# Patient Record
Sex: Female | Born: 1960 | Race: Black or African American | Hispanic: No | Marital: Single | Smoking: Never smoker
Health system: Southern US, Community
[De-identification: ages and names within clinical notes are randomized; demographics above are authoritative.]

## PROBLEM LIST (undated history)

## (undated) ENCOUNTER — Ambulatory Visit: Admission: EM | Payer: BLUE CROSS/BLUE SHIELD | Source: Home / Self Care

## (undated) DIAGNOSIS — J45909 Unspecified asthma, uncomplicated: Secondary | ICD-10-CM

## (undated) DIAGNOSIS — M1990 Unspecified osteoarthritis, unspecified site: Secondary | ICD-10-CM

## (undated) DIAGNOSIS — R011 Cardiac murmur, unspecified: Secondary | ICD-10-CM

## (undated) DIAGNOSIS — I10 Essential (primary) hypertension: Secondary | ICD-10-CM

## (undated) HISTORY — PX: HAND SURGERY: SHX662

## (undated) HISTORY — PX: FOOT SURGERY: SHX648

## (undated) HISTORY — PX: ABDOMINAL HYSTERECTOMY: SHX81

---

## 2004-09-29 ENCOUNTER — Emergency Department: Payer: Self-pay | Admitting: Emergency Medicine

## 2005-02-14 ENCOUNTER — Ambulatory Visit: Payer: Self-pay | Admitting: Orthopedic Surgery

## 2005-04-13 ENCOUNTER — Ambulatory Visit: Payer: Self-pay | Admitting: Orthopedic Surgery

## 2005-04-20 ENCOUNTER — Ambulatory Visit: Payer: Self-pay | Admitting: Orthopedic Surgery

## 2007-05-20 DIAGNOSIS — J309 Allergic rhinitis, unspecified: Secondary | ICD-10-CM | POA: Insufficient documentation

## 2007-05-20 DIAGNOSIS — J4520 Mild intermittent asthma, uncomplicated: Secondary | ICD-10-CM | POA: Insufficient documentation

## 2007-05-20 DIAGNOSIS — G8929 Other chronic pain: Secondary | ICD-10-CM | POA: Insufficient documentation

## 2007-05-23 ENCOUNTER — Ambulatory Visit: Payer: Self-pay | Admitting: Family Medicine

## 2008-11-04 ENCOUNTER — Emergency Department: Payer: Self-pay | Admitting: Internal Medicine

## 2009-08-29 ENCOUNTER — Emergency Department: Payer: Self-pay | Admitting: Emergency Medicine

## 2010-01-12 ENCOUNTER — Emergency Department: Payer: Self-pay | Admitting: Emergency Medicine

## 2010-05-03 ENCOUNTER — Emergency Department: Payer: Self-pay | Admitting: Emergency Medicine

## 2012-12-22 DIAGNOSIS — M542 Cervicalgia: Secondary | ICD-10-CM | POA: Insufficient documentation

## 2013-07-21 ENCOUNTER — Ambulatory Visit: Payer: Self-pay | Admitting: Internal Medicine

## 2013-08-18 DIAGNOSIS — G5600 Carpal tunnel syndrome, unspecified upper limb: Secondary | ICD-10-CM | POA: Insufficient documentation

## 2013-11-27 ENCOUNTER — Ambulatory Visit: Payer: Self-pay | Admitting: Physician Assistant

## 2014-03-03 ENCOUNTER — Ambulatory Visit: Payer: Self-pay | Admitting: Family Medicine

## 2014-03-03 LAB — CBC WITH DIFFERENTIAL/PLATELET
BASOS ABS: 0.1 10*3/uL (ref 0.0–0.1)
Basophil %: 1.3 %
EOS PCT: 4.3 %
Eosinophil #: 0.2 10*3/uL (ref 0.0–0.7)
HCT: 38.8 % (ref 35.0–47.0)
HGB: 12.7 g/dL (ref 12.0–16.0)
LYMPHS ABS: 1.4 10*3/uL (ref 1.0–3.6)
Lymphocyte %: 30.2 %
MCH: 29.8 pg (ref 26.0–34.0)
MCHC: 32.7 g/dL (ref 32.0–36.0)
MCV: 91 fL (ref 80–100)
Monocyte #: 0.4 x10 3/mm (ref 0.2–0.9)
Monocyte %: 8.6 %
Neutrophil #: 2.6 10*3/uL (ref 1.4–6.5)
Neutrophil %: 55.6 %
PLATELETS: 226 10*3/uL (ref 150–440)
RBC: 4.26 10*6/uL (ref 3.80–5.20)
RDW: 13.2 % (ref 11.5–14.5)
WBC: 4.6 10*3/uL (ref 3.6–11.0)

## 2014-03-03 LAB — COMPREHENSIVE METABOLIC PANEL
ALT: 19 U/L (ref 12–78)
Albumin: 3.9 g/dL (ref 3.4–5.0)
Alkaline Phosphatase: 96 U/L
Anion Gap: 6 — ABNORMAL LOW (ref 7–16)
BUN: 12 mg/dL (ref 7–18)
Bilirubin,Total: 0.5 mg/dL (ref 0.2–1.0)
CALCIUM: 9.6 mg/dL (ref 8.5–10.1)
CREATININE: 0.86 mg/dL (ref 0.60–1.30)
Chloride: 103 mmol/L (ref 98–107)
Co2: 30 mmol/L (ref 21–32)
EGFR (African American): >60
EGFR (Non-African Amer.): >60
GLUCOSE: 94 mg/dL (ref 65–99)
OSMOLALITY: 277 (ref 275–301)
Potassium: 3.7 mmol/L (ref 3.5–5.1)
SGOT(AST): 17 U/L (ref 15–37)
Sodium: 139 mmol/L (ref 136–145)
Total Protein: 7.8 g/dL (ref 6.4–8.2)

## 2014-07-20 ENCOUNTER — Ambulatory Visit: Payer: Self-pay | Admitting: Family Medicine

## 2014-07-20 LAB — RAPID STREP-A WITH REFLX: MICRO TEXT REPORT: NEGATIVE

## 2014-07-23 LAB — BETA STREP CULTURE(ARMC)

## 2014-09-14 ENCOUNTER — Ambulatory Visit: Payer: Self-pay | Admitting: Physician Assistant

## 2014-09-14 LAB — URINALYSIS, COMPLETE
BILIRUBIN, UR: NEGATIVE
GLUCOSE, UR: NEGATIVE
Ketone: NEGATIVE
Nitrite: NEGATIVE
PH: 7 (ref 5.0–8.0)
Protein: NEGATIVE
SPECIFIC GRAVITY: 1.015 (ref 1.000–1.030)

## 2014-09-16 LAB — URINE CULTURE

## 2015-01-04 ENCOUNTER — Ambulatory Visit
Admit: 2015-01-04 | Disposition: A | Discharge status: Home / Self Care | Payer: Self-pay | Attending: Family Medicine | Admitting: Family Medicine

## 2015-03-22 ENCOUNTER — Emergency Department: Payer: Worker's Compensation

## 2015-03-22 ENCOUNTER — Ambulatory Visit
Admission: EM | Admit: 2015-03-22 | Discharge: 2015-03-22 | Payer: Worker's Compensation | Attending: Family Medicine | Admitting: Family Medicine

## 2015-03-22 ENCOUNTER — Encounter: Payer: Self-pay | Admitting: *Deleted

## 2015-03-22 ENCOUNTER — Emergency Department
Admission: EM | Admit: 2015-03-22 | Discharge: 2015-03-22 | Disposition: A | Discharge status: Home / Self Care | Payer: Worker's Compensation | Attending: Emergency Medicine | Admitting: Emergency Medicine

## 2015-03-22 DIAGNOSIS — S0990XA Unspecified injury of head, initial encounter: Secondary | ICD-10-CM

## 2015-03-22 DIAGNOSIS — M545 Low back pain, unspecified: Secondary | ICD-10-CM

## 2015-03-22 DIAGNOSIS — S39012D Strain of muscle, fascia and tendon of lower back, subsequent encounter: Secondary | ICD-10-CM | POA: Diagnosis not present

## 2015-03-22 DIAGNOSIS — S4992XD Unspecified injury of left shoulder and upper arm, subsequent encounter: Secondary | ICD-10-CM | POA: Diagnosis not present

## 2015-03-22 DIAGNOSIS — W010XXD Fall on same level from slipping, tripping and stumbling without subsequent striking against object, subsequent encounter: Secondary | ICD-10-CM | POA: Insufficient documentation

## 2015-03-22 DIAGNOSIS — S0990XD Unspecified injury of head, subsequent encounter: Secondary | ICD-10-CM | POA: Insufficient documentation

## 2015-03-22 DIAGNOSIS — M25512 Pain in left shoulder: Secondary | ICD-10-CM

## 2015-03-22 DIAGNOSIS — S79921D Unspecified injury of right thigh, subsequent encounter: Secondary | ICD-10-CM | POA: Diagnosis not present

## 2015-03-22 DIAGNOSIS — Z880 Allergy status to penicillin: Secondary | ICD-10-CM | POA: Insufficient documentation

## 2015-03-22 DIAGNOSIS — M25552 Pain in left hip: Secondary | ICD-10-CM

## 2015-03-22 DIAGNOSIS — I10 Essential (primary) hypertension: Secondary | ICD-10-CM | POA: Diagnosis not present

## 2015-03-22 HISTORY — DX: Essential (primary) hypertension: I10

## 2015-03-22 HISTORY — DX: Cardiac murmur, unspecified: R01.1

## 2015-03-22 HISTORY — DX: Unspecified asthma, uncomplicated: J45.909

## 2015-03-22 MED ORDER — KETOROLAC TROMETHAMINE 10 MG PO TABS
20.0000 mg | ORAL_TABLET | Freq: Once | ORAL | Status: AC
  Administered 2015-03-22: 20 mg via ORAL

## 2015-03-22 MED ORDER — CYCLOBENZAPRINE HCL 10 MG PO TABS
5.0000 mg | ORAL_TABLET | Freq: Once | ORAL | Status: AC
  Administered 2015-03-22: 5 mg via ORAL

## 2015-03-22 MED ORDER — CYCLOBENZAPRINE HCL 10 MG PO TABS
ORAL_TABLET | ORAL | Status: AC
  Administered 2015-03-22: 5 mg via ORAL
  Filled 2015-03-22: qty 1

## 2015-03-22 MED ORDER — CYCLOBENZAPRINE HCL 5 MG PO TABS: 5.0000 mg | ORAL_TABLET | Freq: Three times a day (TID) | ORAL | Status: DC | PRN

## 2015-03-22 MED ORDER — KETOROLAC TROMETHAMINE 10 MG PO TABS
ORAL_TABLET | ORAL | Status: AC
  Administered 2015-03-22: 20 mg via ORAL
  Filled 2015-03-22: qty 2

## 2015-03-22 MED ORDER — KETOROLAC TROMETHAMINE 10 MG PO TABS: 10.0000 mg | ORAL_TABLET | Freq: Three times a day (TID) | ORAL | Status: DC

## 2015-03-22 NOTE — ED Notes (Signed)
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 I hit my left hip and side, and twist my left should back, and I have pain in the back side head (the right side)." Pt denies LOC. Ambulated to exam room 8 without problem.

## 2015-03-22 NOTE — ED Provider Notes (Signed)
CSN: [id number]     Arrival date & time 03/22/15  1531 History   First MD Initiated Contact with Patient 03/22/15 1629     Chief Complaint  Patient presents with  . Fall  . Shoulder Pain  . Head Injury   (Consider location/radiation/quality/duration/timing/severity/associated sxs/prior Treatment) HPI   54 yo F working at [employer] ,placed by [employer]. 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 Approximately 2:45 pm on a return trip to her workspot she fell between the pallet and a metal railing falling to the concrete floor. Remembers the beginning of the fall and later becoming aware that people were around her asking is she was OK and bringing ice pack.. Not sure what happened immediately after fall. Her "right side back of head really hurt bad"and continues to be painful. She reports  unable to get up by herself,was assisted by workmates to a chair, then moved to the workroom. Given tylenol 2 tablets by her supervisor then brought to [facility].States she is not sure if she lost consciousness. Continues to have a headache. Vision is not clear -she doesn't have her glasses with her. She is ambulatory but complaining of left shoulder, left hip, mid low back pain and right thigh discomfort.  Reported as very active. Works out at [employer] and "stay busy". Past Medical History  Diagnosis Date  . Asthma   . Hypertension    Past Surgical History  Procedure Laterality Date  . Abdominal hysterectomy     No family history on file. History  Substance Use Topics  . Smoking status: Never Smoker   . Smokeless tobacco: Not on file  . Alcohol Use: No   OB History    No data available     Review of Systems Constitutional: No fever.  Eyes: No identified visual changes. She doesn't have her glasses. ENT:No sore throat. Cardiovascular:Negative for chest pain/palpitations Respiratory: Negative for  shortness of breath Gastrointestinal: No abdominal pain. No nausea,vomiting, diarrhea Genitourinary: Negative for dysuria. Normal urination. Musculoskeletal: Positive  for lumbar back pain. FROM extremities - left shoulder discomfort, Left hip and right thigh becoming uncomfortable . Left forearm in wrist support from reported tendonitis predating current experience Skin: Negative for rash Neurological: Positive for headache, Negative  focal weakness or numbness Allergies  Codeine; Penicillins; Percocet; Sulfa antibiotics; and Vicodin  Home Medications   Prior to Admission medications   Medication Sig Start Date End Date Taking? Authorizing Provider  albuterol (PROVENTIL HFA;VENTOLIN HFA) 108 (90 BASE) MCG/ACT inhaler Inhale into the lungs every 6 (six) hours as needed for wheezing or shortness of breath.   Yes Historical Provider, MD  lisinopril (PRINIVIL,ZESTRIL) 10 MG tablet Take 10 mg by mouth daily.   Yes Historical Provider, MD   BP 132/76 mmHg  Pulse 71  Temp(Src) 97.8 F (36.6 C) (Tympanic)  Resp 16  Ht  (1.626 m)  Wt 125 lb (56.7 kg)  BMI 21.45 kg/m2  SpO2 100%  LMP  Physical Exam   Constitutional -alert and oriented, in distress c/o right occiput pain Left shoulder, left hip, low back, right thigh discomfort Head- normocephalic, very tender area right occiput to behind right ear Eyes- conjunctiva normal, EOMI ,conjugate gaze Ears- canal clear, TM without evidence of bleed Nose- no congestion or rhinorrhea Mouth/throat- mucous membranes moist ,oropharynx non-erythematous Neck- supple without glandular enlargement, no pain  CV- regular rate, grossly normal heart sounds,  Resp-no distress, normal respiratory effort,clear to auscultation bilaterally Back- very tender to palpation midline low back GI- soft,non-tender,no distention GU-  not examined MSK- Left shoulder full ROM with tenderness at overhead, left hip FROM with tenderness,   right thigh becoming tense  and uncomfortable during visit by her report; on and of table without assistance, ambulatory Neuro- normal speech and language, no gross focal neurological deficit appreciated, no gait instability, CNS as tested grossly WNL Skin-warm,dry ,intact; no rash noted Psych-mood and affect grossly normal; speech and behavior grossly normal  ED Course  Procedures (including critical care time) Labs Review Labs Reviewed - No data to display  Imaging Review No results found.   MDM   1. Head trauma, initial encounter   2. Left shoulder pain   3. Left hip pain   4. Midline low back pain without sciatica     Head trauma concerns discussed with patient and both daughters. Recommend that she transfer to the ER of her choice for more indepth evaluation as we do not have CT available. They wish to transport and she is in agreement. She travelled in by private vehicle and is felt stable for travel. [facility] ER triage nurse is contacted and our baseline information given. Her urinalysis and bretholyzer  Studies have been complete and submitted from here.  Rae HalstedLaurie W Lee, PA-C 03/22/15 [place] [id number]

## 2015-03-22 NOTE — ED Notes (Signed)
Pt here with multiple areas of pain after falling from standing position today.  Pt advises she fell at work at 14:45 and now has a H/A, low back pain, l shoulder pain and right leg pain.

## 2015-03-22 NOTE — ED Provider Notes (Signed)
[facility] Emergency Department Provider Note ____________________________________________  Time seen: 1  I have reviewed the triage vital signs and the nursing notes.  HISTORY  Chief Complaint  Fall  HPI Cindy Fisher is a 54 y.o. female who reports to the ED, transported by her daughter, for evaluation and management of symptoms sustained after a ground level fall at work today. She was evaluated at the [facility] urgent care prior to arrival here, at about 3:30 this afternoon. She describes to me a fall due to tripping, at about 2:45 PM. She was walking between pallets and a rail, at her workstation when she inadvertently tripped, falling, she describes to her right side. She denies head injury here, but notes pain to the posterior head and scalp.She also is complaining of right low back pain, right thigh pain, and some left shoulder discomfort, that she thinks was sustained when she fell landing with the left arm behind her. She is here at the encouragement of the provider at urgent care, for further evaluation of her presumed closed head injury.  Past Medical History  Diagnosis Date  . Asthma   . Hypertension   . Heart murmur    There are no active problems to display for this patient.  Past Surgical History  Procedure Laterality Date  . Abdominal hysterectomy     Current Outpatient Rx  Name  Route  Sig  Dispense  Refill  . albuterol (PROVENTIL HFA;VENTOLIN HFA) 108 (90 BASE) MCG/ACT inhaler   Inhalation   Inhale into the lungs every 6 (six) hours as needed for wheezing or shortness of breath.         . cyclobenzaprine (FLEXERIL) 5 MG tablet   Oral   Take 1 tablet (5 mg total) by mouth every 8 (eight) hours as needed for muscle spasms.   10 tablet   0   . ketorolac (TORADOL) 10 MG tablet   Oral   Take 1 tablet (10 mg total) by mouth every 8 (eight) hours.   15 tablet   0   . lisinopril (PRINIVIL,ZESTRIL) 10 MG tablet   Oral   Take 10 mg  by mouth daily.          Allergies Codeine; Penicillins; Percocet; Sulfa antibiotics; and Vicodin  No family history on file.  Social History History  Substance Use Topics  . Smoking status: Never Smoker   . Smokeless tobacco: Not on file  . Alcohol Use: No   Review of Systems  Constitutional: Negative for fever. Eyes: Negative for visual changes. ENT: Negative for sore throat. Cardiovascular: Negative for chest pain. Respiratory: Negative for shortness of breath. Gastrointestinal: Negative for abdominal pain, vomiting and diarrhea. Genitourinary: Negative for dysuria. Musculoskeletal: Positive for low back pain, right thigh, and left shoulder pain. Skin: Negative for rash. Neurological: Negative for headaches, focal weakness or numbness. Reports posterior head pain.  ____________________________________________  PHYSICAL EXAM:  VITAL SIGNS: ED Triage Vitals  Enc Vitals Group     BP --      Pulse --      Resp --      Temp --      Temp src --      SpO2 --      Weight --      Height --      Head Cir --      Peak Flow --      Pain Score 03/22/15 1833 10     Pain Loc --  Pain Edu? --      Excl. in GC? --    Constitutional: Alert and oriented. Well appearing and in no distress. Eyes: Conjunctivae are normal. PERRL. Normal extraocular movements. Normal fundi bilaterally.  ENT   Head: Normocephalic and atraumatic. Mildly tender to palp over the posterior right occiput without erythema, edema, induration, abrasion, or laceration.    Nose: No congestion/rhinnorhea/epistaxis.      Ears: TMs clear bilaterally without bulge or rupture. Canals clear.    Mouth/Throat: Mucous membranes are moist.   Neck: Supple. No thyromegaly. Hematological/Lymphatic/Immunilogical: No cervical lymphadenopathy. Cardiovascular: Normal rate, regular rhythm.  Respiratory: Normal respiratory effort. No wheezes/rales/rhonchi. Gastrointestinal: Soft and nontender. No  distention. Musculoskeletal: Nontender with normal range of motion in all extremities. Normal spinal alignment without spasm, deformity, or step-off. Normal sit-stand transition. Normal lumbar flexion to the ankles, and normal extension. Negative SLR bilaterally.  Neurologic:  Normal gait without ataxia. Normal speech and language. No gross focal neurologic deficits are appreciated. CN II-XII grossly intact. Normal toe/heel raise.  Skin:  Skin is warm, dry and intact. No rash noted. Psychiatric: Mood and affect are normal. Patient exhibits appropriate insight and judgment. ____________________________________________   RADIOLOGY Head CT w/o contrast IMPRESSION: No acute intracranial process. ____________________________________________  PROCEDURES  Toradol 20 mg PO Flexeril 10 mg PO ____________________________________________  INITIAL IMPRESSION / ASSESSMENT AND PLAN / ED COURSE  Ground level fall with minor head injury and questionable LOC. Normal exam and head CT without neurological deficit. Strain to the low back and left shoulder related to fall.  Follow-up with W/C provider or company representative. Prescription Toradol #15 and Flexeril #10 as directed. Work note for OOW x 3 days given. ____________________________________________  FINAL CLINICAL IMPRESSION(S) / ED DIAGNOSES  Final diagnoses:  Fall from slip, trip, or stumble, subsequent encounter  Minor head injury, subsequent encounter  Lumbar strain, subsequent encounter     Lissa HoardJenise V Bacon Jericka Kadar, PA-C 03/22/15 2020  Arnaldo NatalPaul F Malinda, MD 03/22/15 2232

## 2015-03-22 NOTE — ED Notes (Signed)
Breath Analysis and Drug Urine Screen Collected per [employer] request. [facility] as directed by Provider for further workup.

## 2015-03-22 NOTE — Discharge Instructions (Signed)
Head Injury You have a head injury. Headaches and throwing up (vomiting) are common after a head injury. It should be easy to wake up from sleeping. Sometimes you must stay in the hospital. Most problems happen within the first 24 hours. Side effects may occur up to 7-10 days after the injury.  WHAT ARE THE TYPES OF HEAD INJURIES? Head injuries can be as minor as a bump. Some head injuries can be more severe. More severe head injuries include:  A jarring injury to the brain (concussion).  A bruise of the brain (contusion). This mean there is bleeding in the brain that can cause swelling.  A cracked skull (skull fracture).  Bleeding in the brain that collects, clots, and forms a bump (hematoma). WHEN SHOULD I GET HELP RIGHT AWAY?   You are confused or sleepy.  You cannot be woken up.  You feel sick to your stomach (nauseous) or keep throwing up (vomiting).  Your dizziness or unsteadiness is getting worse.  You have very bad, lasting headaches that are not helped by medicine. Take medicines only as told by your doctor.  You cannot use your arms or legs like normal.  You cannot walk.  You notice changes in the black spots in the center of the colored part of your eye (pupil).  You have clear or bloody fluid coming from your nose or ears.  You have trouble seeing. During the next 24 hours after the injury, you must stay with someone who can watch you. This person should get help right away (call 911 in the [place] if you start to shake and are not able to control it (have seizures), you pass out, or you are unable to wake up. HOW CAN I PREVENT A HEAD INJURY IN THE FUTURE?  Wear seat belts.  Wear a helmet while bike riding and playing sports like football.  Stay away from dangerous activities around the house. WHEN CAN I RETURN TO NORMAL ACTIVITIES AND ATHLETICS? See your doctor before doing these activities. You should not do normal activities or play contact sports until 1 week  after the following symptoms have stopped:  Headache that does not go away.  Dizziness.  Poor attention.  Confusion.  Memory problems.  Sickness to your stomach or throwing up.  Tiredness.  Fussiness.  Bothered by bright lights or loud noises.  Anxiousness or depression.  Restless sleep. MAKE SURE YOU:   Understand these instructions.  Will watch your condition.  Will get help right away if you are not doing well or get worse. Document Released: 08/16/2008 Document Revised: 01/18/2014 Document Reviewed: 05/11/2013 [facility] Patient Information 2015 [place], [place]. This information is not intended to replace advice given to you by your health care provider. Make sure you discuss any questions you have with your health care provider.  Lumbosacral Strain Lumbosacral strain is a strain of any of the parts that make up your lumbosacral vertebrae. Your lumbosacral vertebrae are the bones that make up the lower third of your backbone. Your lumbosacral vertebrae are held together by muscles and tough, fibrous tissue (ligaments).  CAUSES  A sudden blow to your back can cause lumbosacral strain. Also, anything that causes an excessive stretch of the muscles in the low back can cause this strain. This is typically seen when people exert themselves strenuously, fall, lift heavy objects, bend, or crouch repeatedly. RISK FACTORS  Physically demanding work.  Participation in pushing or pulling sports or sports that require a sudden twist of the back (tennis,  golf, baseball).  Weight lifting.  Excessive lower back curvature.  Forward-tilted pelvis.  Weak back or abdominal muscles or both.  Tight hamstrings. SIGNS AND SYMPTOMS  Lumbosacral strain may cause pain in the area of your injury or pain that moves (radiates) down your leg.  DIAGNOSIS Your health care provider can often diagnose lumbosacral strain through a physical exam. In some cases, you may need tests such as X-ray  exams.  TREATMENT  Treatment for your lower back injury depends on many factors that your clinician will have to evaluate. However, most treatment will include the use of anti-inflammatory medicines. HOME CARE INSTRUCTIONS   Avoid hard physical activities (tennis, racquetball, waterskiing) if you are not in proper physical condition for it. This may aggravate or create problems.  If you have a back problem, avoid sports requiring sudden body movements. Swimming and walking are generally safer activities.  Maintain good posture.  Maintain a healthy weight.  For acute conditions, you may put ice on the injured area.  Put ice in a plastic bag.  Place a towel between your skin and the bag.  Leave the ice on for 20 minutes, 2-3 times a day.  When the low back starts healing, stretching and strengthening exercises may be recommended. SEEK MEDICAL CARE IF:  Your back pain is getting worse.  You experience severe back pain not relieved with medicines. SEEK IMMEDIATE MEDICAL CARE IF:   You have numbness, tingling, weakness, or problems with the use of your arms or legs.  There is a change in bowel or bladder control.  You have increasing pain in any area of the body, including your belly (abdomen).  You notice shortness of breath, dizziness, or feel faint.  You feel sick to your stomach (nauseous), are throwing up (vomiting), or become sweaty.  You notice discoloration of your toes or legs, or your feet get very cold. MAKE SURE YOU:   Understand these instructions.  Will watch your condition.  Will get help right away if you are not doing well or get worse. Document Released: 06/13/2005 Document Revised: 09/08/2013 Document Reviewed: 04/22/2013 [facility] Patient Information 2015 [place]. This information is not intended to replace advice given to you by your health care provider. Make sure you discuss any questions you have with your health care provider.  Your exam  and head CT were normal tonight following your fall at work.  Keep ice on any sore muscles. Take the prescription meds as directed.  Follow-up with [facility] or your company's medical provider. Your symptoms should resolve in a few days.

## 2015-03-28 ENCOUNTER — Encounter: Payer: Self-pay | Admitting: Emergency Medicine

## 2015-03-28 ENCOUNTER — Ambulatory Visit
Admission: EM | Admit: 2015-03-28 | Discharge: 2015-03-28 | Disposition: A | Discharge status: Home / Self Care | Payer: Worker's Compensation | Attending: Internal Medicine | Admitting: Internal Medicine

## 2015-03-28 DIAGNOSIS — T148XXA Other injury of unspecified body region, initial encounter: Secondary | ICD-10-CM

## 2015-03-28 DIAGNOSIS — S39012D Strain of muscle, fascia and tendon of lower back, subsequent encounter: Secondary | ICD-10-CM

## 2015-03-28 DIAGNOSIS — S46912D Strain of unspecified muscle, fascia and tendon at shoulder and upper arm level, left arm, subsequent encounter: Secondary | ICD-10-CM

## 2015-03-28 DIAGNOSIS — S76012D Strain of muscle, fascia and tendon of left hip, subsequent encounter: Secondary | ICD-10-CM

## 2015-03-28 MED ORDER — KETOROLAC TROMETHAMINE 10 MG PO TABS: 10.0000 mg | ORAL_TABLET | Freq: Four times a day (QID) | ORAL | Status: DC | PRN

## 2015-03-28 MED ORDER — METAXALONE 800 MG PO TABS: 800.0000 mg | ORAL_TABLET | Freq: Three times a day (TID) | ORAL | Status: DC

## 2015-03-28 NOTE — ED Provider Notes (Signed)
CSN: [id number]     Arrival date & time 03/28/15  0808 History   First MD Initiated Contact with Patient 03/28/15 [phone]     Chief Complaint  Patient presents with  . 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  Patient reported still having muscle spasms taking flexeril at bedtime unsure if helping because makes her sleepy/falls asleep and toradol po once a day.  Noticed large bruise on left hip next day.  Still having left shoulder pain also especially if raising arm overhead. Hx left wrist tendonitis still wearing wrist splint/having pain.  Neck muscles/scapular muscles tight/spasms.   Denied headache, nausea, vomiting, loss of bowel/bladder control, saddle paresthesias, arm/leg weakness.  The history is provided by the patient.    Past Medical History  Diagnosis Date  . Asthma   . Hypertension   . Heart murmur    Past Surgical History  Procedure Laterality Date  . Abdominal hysterectomy     History reviewed. No pertinent family history. History  Substance Use Topics  . Smoking status: Never Smoker   . Smokeless tobacco: Not on file  . Alcohol Use: No   OB History    No data available     Review of Systems  Constitutional: Negative for fever, chills, diaphoresis, activity change, appetite change and fatigue.  HENT: Negative for facial swelling, nosebleeds, trouble swallowing and voice change.   Eyes: Negative for photophobia, pain, discharge, redness, itching and visual disturbance.  Respiratory: Negative for cough, choking, shortness of breath, wheezing and stridor.   Cardiovascular: Negative for chest pain, palpitations and leg swelling.  Gastrointestinal: Negative for nausea, vomiting, abdominal pain, diarrhea, constipation and blood in stool.    Endocrine: Negative for cold intolerance and heat intolerance.  Genitourinary: Negative for dysuria and difficulty urinating.  Musculoskeletal: Positive for myalgias, back pain and arthralgias. Negative for joint swelling, gait problem, neck pain and neck stiffness.  Skin: Positive for color change. Negative for pallor, rash and wound.  Allergic/Immunologic: Negative for environmental allergies and food allergies.  Neurological: Negative for dizziness, tremors, seizures, syncope, facial asymmetry, speech difficulty, weakness, light-headedness, numbness and headaches.  Hematological: Negative for adenopathy. Does not bruise/bleed easily.  Psychiatric/Behavioral: Negative for behavioral problems, confusion, sleep disturbance, decreased concentration and agitation.    Allergies  Codeine; Penicillins; Percocet; Sulfa antibiotics; and Vicodin  Home Medications   Prior to Admission medications   Medication Sig Start Date End Date Taking? Authorizing Provider  albuterol (PROVENTIL HFA;VENTOLIN HFA) 108 (90 BASE) MCG/ACT inhaler Inhale into the lungs every 6 (six) hours as needed for wheezing or shortness of breath.    Historical Provider, MD  ketorolac (TORADOL) 10 MG tablet Take 1 tablet (10 mg total) by mouth every 8 (eight) hours. 03/22/15   Jenise V Bacon Menshew, PA-C  ketorolac (TORADOL) 10 MG tablet Take 1 tablet (10 mg total) by mouth every 6 (six) hours as needed for moderate pain. 03/28/15   Barbaraann Barthelina A Betancourt, NP  lisinopril (PRINIVIL,ZESTRIL) 10 MG tablet Take 10 mg by mouth daily.    Historical Provider, MD  metaxalone (SKELAXIN) 800 MG tablet Take 1 tablet (800 mg total) by mouth 3 (three) times daily. 03/28/15   Barbaraann Barthelina A Betancourt, NP   BP 166/76 mmHg  Pulse 60  Temp(Src) 97.5 F (36.4 C) (Tympanic)  Resp 16  Ht 5\' 4"  (1.626  m)  SpO2 100% Physical Exam  Constitutional: She is oriented to person, place, and time. Vital signs are normal. She appears well-developed and well-nourished.  No distress.  HENT:  Head: Normocephalic and atraumatic.  Right Ear: External ear normal.  Left Ear: External ear normal.  Nose: Nose normal.  Mouth/Throat: Oropharynx is clear and moist. No oropharyngeal exudate.  Eyes: Conjunctivae, EOM and lids are normal. Pupils are equal, round, and reactive to light. Right eye exhibits no discharge. Left eye exhibits no discharge. No scleral icterus.  Neck: Trachea normal and normal range of motion. Neck supple. No spinous process tenderness and no muscular tenderness present. No rigidity. No tracheal deviation, no edema, no erythema and normal range of motion present. No Brudzinski's sign and no Kernig's sign noted. No thyromegaly present.  Bilateral trapezius muscles tight  Cardiovascular: Normal rate, regular rhythm, normal heart sounds and intact distal pulses.  Exam reveals no gallop and no friction rub.   No murmur heard. Pulmonary/Chest: Effort normal and breath sounds normal. No stridor. No respiratory distress. She has no wheezes.  Abdominal: Soft. She exhibits no distension.  Musculoskeletal: She exhibits tenderness. She exhibits no edema.       Right shoulder: Normal.       Left shoulder: She exhibits decreased range of motion, tenderness and pain. She exhibits no bony tenderness, no swelling, no effusion, no crepitus, no deformity, no laceration, no spasm, normal pulse and normal strength.       Right elbow: Normal.      Left elbow: Normal.       Right wrist: Normal.       Left wrist: She exhibits tenderness. She exhibits normal range of motion, no bony tenderness, no swelling, no effusion, no crepitus, no deformity and no laceration.       Right hip: Normal.       Left hip: She exhibits tenderness. She exhibits normal range of motion, normal strength, no bony tenderness, no swelling, no crepitus, no deformity and no laceration.       Cervical back: Normal.       Thoracic back: She exhibits tenderness, pain and spasm. She exhibits normal  range of motion, no bony tenderness, no swelling, no edema, no deformity, no laceration and normal pulse.       Lumbar back: She exhibits tenderness, pain and spasm. She exhibits normal range of motion, no bony tenderness, no swelling, no edema, no deformity, no laceration and normal pulse.       Back:       Left upper arm: She exhibits no tenderness, no bony tenderness, no swelling, no edema, no deformity and no laceration.       Left forearm: Normal.       Left hand: Normal.  Right rotation and lateral bending worsen back pain; atchley scratch above head worsens left shoulder pain; negative empty can test; abduction/adduction left arm worsens pain; unchanged with internal and external rotation left shoulder pain  Lymphadenopathy:    She has no cervical adenopathy.  Neurological: She is alert and oriented to person, place, and time. She has normal reflexes. She displays normal reflexes. No cranial nerve deficit. She exhibits normal muscle tone. Coordination normal.  Skin: Skin is warm, dry and intact. Bruising and ecchymosis noted. No abrasion, no burn, no laceration, no lesion, no petechiae and no rash noted. She is not diaphoretic. No cyanosis or erythema. No pallor. Nails show no clubbing.     Ecchymosis purple, green, yellow 5x3 cm left  lateral hip  Psychiatric: She has a normal mood and affect. Her speech is normal and behavior is normal. Judgment and thought content normal. Cognition and memory are normal.  Nursing note and vitals reviewed.   ED Course  Procedures (including critical care time) Labs Review Labs Reviewed - No data to display  Imaging Review No results found.   MDM   1. Low back strain, subsequent encounter   2. Contusion   3. Hip strain, left, subsequent encounter   4. Shoulder strain, left, subsequent encounter    For acute pain, rest, and intermittent application of heat (do not sleep on heating pad).  I discussed longer-term treatment plan of PRN PO NSAIDS  and I discussed a home back care exercise program with a strengthening and flexibility exercise.  Patient given Exitcare handout on back strain with rehab exercises.  Will change from flexeril to skelaxin due to sedation.  Avoid operation of dangerous machinery and alcohol intake while on skelaxin.  Avoid dehydration take toradol at least twice per day if still having pain may take up to QID.  Avoid other NSAID intake while taking toradol.  Proper avoidance of heavy lifting (greater than 10 lbs) discussed. Avoid impact activities to left hand/arm.  Consider physical therapy or chiropractic care and radiology if not improving.  Call or return to clinic as needed if these symptoms worsen or fail to improve as anticipated especially leg weakness, loss of bowel/bladder control or saddle paresthesias.   Patient verbalized understanding of instructions/information and agreed with plan of care.  P2:  Injury Prevention, fitness  Patient was instructed to rest, ice and elevate arm.  Exitcare handout on wrist pain given to patient.   Medications as directed.  No NSAIDS while taking toradol.  Work note with restrictions given to patient.  Call or return to clinic as needed if these symptoms worsen or fail to improve as anticipated and will consider orthopedics evaluation.  Patient verbalized agreement and understanding of treatment plan.  P2:  ROM, injury prevention  Patient given work note with restrictions.  Patient was instructed to rest, ice, and ROM exercises.  Activity as tolerated.  Patient is to take OTC po NSAIDS as needed.  Follow up if symptoms persist or worsen.  Exitcare handout on hip pain given to patient.  Follow up in 1 week for re-evaluation.  Patient verbalized agreement and understanding of treatment plan.  P2:  Injury Prevention and Fitness.    Barbaraann Barthel, NP 03/28/15 1519

## 2015-03-28 NOTE — ED Notes (Signed)
Patient here for follow-up visit for worker's comp injury. Patient c/o ongoing pain in her lower back and left arm.

## 2015-03-28 NOTE — Discharge Instructions (Signed)
Hip Pain Your hip is the joint between your upper legs and your lower pelvis. The bones, cartilage, tendons, and muscles of your hip joint perform a lot of work each day supporting your body weight and allowing you to move around. Hip pain can range from a minor ache to severe pain in one or both of your hips. Pain may be felt on the inside of the hip joint near the groin, or the outside near the buttocks and upper thigh. You may have swelling or stiffness as well.  HOME CARE INSTRUCTIONS   Take medicines only as directed by your health care provider.  Apply ice to the injured area:  Put ice in a plastic bag.  Place a towel between your skin and the bag.  Leave the ice on for 15-20 minutes at a time, 3-4 times a day.  Keep your leg raised (elevated) when possible to lessen swelling.  Avoid activities that cause pain.  Follow specific exercises as directed by your health care provider.  Sleep with a pillow between your legs on your most comfortable side.  Record how often you have hip pain, the location of the pain, and what it feels like. SEEK MEDICAL CARE IF:   You are unable to put weight on your leg.  Your hip is red or swollen or very tender to touch.  Your pain or swelling continues or worsens after 1 week.  You have increasing difficulty walking.  You have a fever. SEEK IMMEDIATE MEDICAL CARE IF:   You have fallen.  You have a sudden increase in pain and swelling in your hip. MAKE SURE YOU:   Understand these instructions.  Will watch your condition.  Will get help right away if you are not doing well or get worse. Document Released: 02/21/2010 Document Revised: 01/18/2014 Document Reviewed: 04/30/2013 [facility] Patient Information 2015 [place], [place]. This information is not intended to replace advice given to you by your health care provider. Make sure you discuss any questions you have with your health care provider. Shoulder Sprain A shoulder sprain is the  result of damage to the tough, fiber-like tissues (ligaments) that help hold your shoulder in place. The ligaments may be stretched or torn. Besides the main shoulder joint (the ball and socket), there are several smaller joints that connect the bones in this area. A sprain usually involves one of those joints. Most often it is the acromioclavicular (or AC) joint. That is the joint that connects the collarbone (clavicle) and the shoulder blade (scapula) at the top point of the shoulder blade (acromion). A shoulder sprain is a mild form of what is called a shoulder separation. Recovering from a shoulder sprain may take some time. For some, pain lingers for several months. Most people recover without long term problems. CAUSES   A shoulder sprain is usually caused by some kind of trauma. This might be:  Falling on an outstretched arm.  Being hit hard on the shoulder.  Twisting the arm.  Shoulder sprains are more likely to occur in people who:  Play sports.  Have balance or coordination problems. SYMPTOMS   Pain when you move your shoulder.  Limited ability to move the shoulder.  Swelling and tenderness on top of the shoulder.  Redness or warmth in the shoulder.  Bruising.  A change in the shape of the shoulder. DIAGNOSIS  Your healthcare provider may:  Ask about your symptoms.  Ask about recent activity that might have caused those symptoms.  Examine your  shoulder. You may be asked to do simple exercises to test movement. The other shoulder will be examined for comparison.  Order some tests that provide a look inside the body. They can show the extent of the injury. The tests could include:  X-rays.  CT (computed tomography) scan.  MRI (magnetic resonance imaging) scan. RISKS AND COMPLICATIONS  Loss of full shoulder motion.  Ongoing shoulder pain. TREATMENT  How long it takes to recover from a shoulder sprain depends on how severe it was. Treatment options may  include:  Rest. You should not use the arm or shoulder until it heals.  Ice. For 2 or 3 days after the injury, put an ice pack on the shoulder up to 4 times a day. It should stay on for 15 to 20 minutes each time. Wrap the ice in a towel so it does not touch your skin.  Over-the-counter medicine to relieve pain.  A sling or brace. This will keep the arm still while the shoulder is healing.  Physical therapy or rehabilitation exercises. These will help you regain strength and motion. Ask your healthcare provider when it is OK to begin these exercises.  Surgery. The need for surgery is rare with a sprained shoulder, but some people may need surgery to keep the joint in place and reduce pain. HOME CARE INSTRUCTIONS   Ask your healthcare provider about what you should and should not do while your shoulder heals.  Make sure you know how to apply ice to the correct area of your shoulder.  Talk with your healthcare provider about which medications should be used for pain and swelling.  If rehabilitation therapy will be needed, ask your healthcare provider to refer you to a therapist. If it is not recommended, then ask about at-home exercises. Find out when exercise should begin. SEEK MEDICAL CARE IF:  Your pain, swelling, or redness at the joint increases. SEEK IMMEDIATE MEDICAL CARE IF:   You have a fever.  You cannot move your arm or shoulder. Document Released: 01/20/2009 Document Revised: 11/26/2011 Document Reviewed: 01/20/2009 [facility] Patient Information 2015 [place], [place]. This information is not intended to replace advice given to you by your health care provider. Make sure you discuss any questions you have with your health care provider. Contusion A contusion is a deep bruise. Contusions are the result of an injury that caused bleeding under the skin. The contusion may turn blue, purple, or yellow. Minor injuries will give you a painless contusion, but more severe contusions may  stay painful and swollen for a few weeks.  CAUSES  A contusion is usually caused by a blow, trauma, or direct force to an area of the body. SYMPTOMS   Swelling and redness of the injured area.  Bruising of the injured area.  Tenderness and soreness of the injured area.  Pain. DIAGNOSIS  The diagnosis can be made by taking a history and physical exam. An X-ray, CT scan, or MRI may be needed to determine if there were any associated injuries, such as fractures. TREATMENT  Specific treatment will depend on what area of the body was injured. In general, the best treatment for a contusion is resting, icing, elevating, and applying cold compresses to the injured area. Over-the-counter medicines may also be recommended for pain control. Ask your caregiver what the best treatment is for your contusion. HOME CARE INSTRUCTIONS   Put ice on the injured area.  Put ice in a plastic bag.  Place a towel between your skin  and the bag.  Leave the ice on for 15-20 minutes, 3-4 times a day, or as directed by your health care provider.  Only take over-the-counter or prescription medicines for pain, discomfort, or fever as directed by your caregiver. Your caregiver may recommend avoiding anti-inflammatory medicines (aspirin, ibuprofen, and naproxen) for 48 hours because these medicines may increase bruising.  Rest the injured area.  If possible, elevate the injured area to reduce swelling. SEEK IMMEDIATE MEDICAL CARE IF:   You have increased bruising or swelling.  You have pain that is getting worse.  Your swelling or pain is not relieved with medicines. MAKE SURE YOU:   Understand these instructions.  Will watch your condition.  Will get help right away if you are not doing well or get worse. Document Released: 06/13/2005 Document Revised: 09/08/2013 Document Reviewed: 07/09/2011 [facility] Patient Information 2015 [place], [place]. This information is not intended to replace advice given to  you by your health care provider. Make sure you discuss any questions you have with your health care provider. Mid-Back Strain with Rehab  A strain is an injury in which a tendon or muscle is torn. The muscles and tendons of the mid-back are vulnerable to strains. However, these muscles and tendons are very strong and require a great force to be injured. The muscles of the mid-back are responsible for stabilizing the spinal column, as well as spinal twisting (rotation). Strains are classified into three categories. Grade 1 strains cause pain, but the tendon is not lengthened. Grade 2 strains include a lengthened ligament, due to the ligament being stretched or partially ruptured. With grade 2 strains there is still function, although the function may be decreased. Grade 3 strains involve a complete tear of the tendon or muscle, and function is usually impaired. SYMPTOMS   Pain in the middle of the back.  Pain that may affect only one side, and is worse with movement.  Muscle spasms, and often swelling in the back.  Loss of strength of the back muscles.  Crackling sound (crepitation) when the muscles are touched. CAUSES  Mid-back strains occur when a force is placed on the muscles or tendons that is greater than they can handle. Common causes of injury include:  Ongoing overuse of the muscle-tendon units in the middle back, usually from incorrect body posture.  A single violent injury or force applied to the back. RISK INCREASES WITH:  Sports that involve twisting forces on the spine or a lot of bending at the waist (football, rugby, weightlifting, bowling, golf, tennis, speed skating, racquetball, swimming, running, gymnastics, diving).  Poor strength and flexibility.  Failure to warm up properly before activity.  Family history of low back pain or disk disorders.  Previous back injury or surgery (especially fusion). PREVENTION  Learn and use proper sports technique.  Warm up and  stretch properly before activity.  Allow for adequate recovery between workouts.  Maintain physical fitness:  Strength, flexibility, and endurance.  Cardiovascular fitness. PROGNOSIS  If treated properly, mid-back strains usually heal within 6 weeks. RELATED COMPLICATIONS   Frequently recurring symptoms, resulting in a chronic problem. Properly treating the problem the first time decreases frequency of recurrence.  Chronic inflammation, scarring, and partial muscle-tendon tear.  Delayed healing or resolution of symptoms, especially if activity is resumed too soon.  Prolonged disability. TREATMENT Treatment first involves the use of ice and medicine, to reduce pain and inflammation. As the pain begins to subside, you may begin strengthening and stretching exercises to improve body  posture and sport technique. These exercises may be performed at home or with a therapist. Severe injuries may require referral to a therapist for further evaluation and treatment, such as ultrasound. Corticosteroid injections may be given to help reduce inflammation. Biofeedback (watching monitors of your body processes) and psychotherapy may also be prescribed. Prolonged bed rest is felt to do more harm than good. Massage may help break the muscle spasms. Sometimes, an injection of cortisone, with or without local anesthetics, may be given to help relieve the pain and spasms. MEDICATION   If pain medicine is needed, nonsteroidal anti-inflammatory medicines (aspirin and ibuprofen), or other minor pain relievers (acetaminophen), are often advised.  Do not take pain medicine for 7 days before surgery.  Prescription pain relievers may be given, if your caregiver thinks they are needed. Use only as directed and only as much as you need.  Ointments applied to the skin may be helpful.  Corticosteroid injections may be given by your caregiver. These injections should be reserved for the most serious cases, because  they may only be given a certain number of times. HEAT AND COLD:   Cold treatment (icing) should be applied for 10 to 15 minutes every 2 to 3 hours for inflammation and pain, and immediately after activity that aggravates your symptoms. Use ice packs or an ice massage.  Heat treatment may be used before performing stretching and strengthening activities prescribed by your caregiver, physical therapist, or athletic trainer. Use a heat pack or a warm water soak. SEEK IMMEDIATE MEDICAL CARE IF:  Symptoms get worse or do not improve in 2 to 4 weeks, despite treatment.  You develop numbness, weakness, or loss of bowel or bladder function.  New, unexplained symptoms develop. (Drugs used in treatment may produce side effects.) EXERCISES RANGE OF MOTION (ROM) AND STRETCHING EXERCISES - Mid-Back Strain These exercises may help you when beginning to rehabilitate your injury. In order to successfully resolve your symptoms, you must improve your posture. These exercises are designed to help reduce the forward-head and rounded-shoulder posture which contributes to this condition. Your symptoms may resolve with or without further involvement from your physician, physical therapist or athletic trainer. While completing these exercises, remember:   Restoring tissue flexibility helps normal motion to return to the joints. This allows healthier, less painful movement and activity.  An effective stretch should be held for at least 30 seconds.  A stretch should never be painful. You should only feel a gentle lengthening or release in the stretched tissue. STRETCH - Axial Extension  Stand or sit on a firm surface. Assume a good posture: chest up, shoulders drawn back, stomach muscles slightly tense, knees unlocked (if standing) and feet hip width apart.  Slowly retract your chin, so your head slides back and your chin slightly lowers. Continue to look straight ahead.  You should feel a gentle stretch in the  back of your head. Be certain not to feel an aggressive stretch since this can cause headaches later.  Hold for __________ seconds. Repeat __________ times. Complete this exercise __________ times per day. RANGE OF MOTION- Upper Thoracic Extension  Sit on a firm chair with a high back. Assume a good posture: chest up, shoulders drawn back, abdominal muscles slightly tense, and feet hip width apart. Place a small pillow or folded towel in the curve of your lower back, if you are having difficulty maintaining good posture.  Gently brace your neck with your hands, allowing your arms to rest on your chest.  Continue to support your neck and slowly extend your back over the chair. You will feel a stretch across your upper back.  Hold __________ seconds. Slowly return to the starting position. Repeat __________ times. Complete this exercise __________ times per day. RANGE OF MOTION- Mid-Thoracic Extension  Roll a towel so that it is about 4 inches in diameter.  Position the towel lengthwise. Lay on the towel so that your spine, but not your shoulder blades, are supported.  You should feel your mid-back arching toward the floor. To increase the stretch, extend your arms away from your body.  Hold for __________ seconds. Repeat exercise __________ times, __________ times per day. STRENGTHENING EXERCISES - Mid-Back Strain These exercises may help you when beginning to rehabilitate your injury. They may resolve your symptoms with or without further involvement from your physician, physical therapist or athletic trainer. While completing these exercises, remember:   Muscles can gain both the endurance and the strength needed for everyday activities through controlled exercises.  Complete these exercises as instructed by your physician, physical therapist or athletic trainer. Increase the resistance and repetitions only as guided by your caregiver.  You may experience muscle soreness or fatigue,  but the pain or discomfort you are trying to eliminate should never worsen during these exercises. If this pain does worsen, stop and make certain you are following the directions exactly. If the pain is still present after adjustments, discontinue the exercise until you can discuss the trouble with your caregiver. STRENGTHENING - Quadruped, Opposite UE/LE Lift  Assume a hands and knees position on a firm surface. Keep your hands under your shoulders and your knees under your hips. You may place padding under your knees for comfort.  Find your neutral spine and gently tense your abdominal muscles so that you can maintain this position. Your shoulders and hips should form a rectangle that is parallel with the floor and is not twisted.  Keeping your trunk steady, lift your right hand no higher than your shoulder and then your left leg no higher than your hip. Make sure you are not holding your breath. Hold this position __________ seconds.  Continuing to keep your abdominal muscles tense and your back steady, slowly return to your starting position. Repeat with the opposite arm and leg. Repeat __________ times. Complete this exercise __________ times per day.  STRENGTH - Shoulder Extensors  Secure a rubber exercise band or tubing to a fixed object (table, pole) so that it is at the height of your shoulders when you are either standing, or sitting on a firm armless chair.  With a thumbs-up grip, grasp an end of the band in each hand. Straighten your elbows and lift your hands straight in front of you at shoulder height. Step back away from the secured end of band, until it becomes tense.  Squeezing your shoulder blades together, pull your hands down to the sides of your thighs. Do not allow your hands to go behind you.  Hold for __________ seconds. Slowly ease the tension on the band, as you reverse the directions and return to the starting position. Repeat __________ times. Complete this exercise  __________ times per day.  STRENGTH - Horizontal Abductors Choose one of the two positions to complete this exercise. Prone: lying on stomach:  Lie on your stomach on a firm surface so that your right / left arm overhangs the edge. Rest your forehead on your opposite forearm. With your palm facing the floor and your elbow straight, hold  a __________ weight in your hand.  Squeeze your right / left shoulder blade to your mid-back spine and then slowly raise your arm to the height of the bed.  Hold for __________ seconds. Slowly reverse the directions and return to the starting position, controlling the weight as you lower your arm. Repeat __________ times. Complete this exercise __________ times per day. Standing:   Secure a rubber exercise band or tubing, so that it is at the height of your shoulders when you are either standing, or sitting on a firm armless chair.  Grasp an end of the band in each hand and have your palms face each other. Straighten your elbows and lift your hands straight in front of you at shoulder height. Step back away from the secured end of band, until it becomes tense.  Squeeze your shoulder blades together. Keeping your elbows locked and your hands at shoulder height, spread your arms apart, forming a "T" shape with your body. Hold __________ seconds. Slowly ease the tension on the band, as you reverse the directions and return to the starting position. Repeat __________ times. Complete this exercise __________ times per day. STRENGTH - Scapular Retractors and External Rotators, Rowing  Secure a rubber exercise band or tubing, so that it is at the height of your shoulders when you are either standing, or sitting on a firm armless chair.  With a palm-down grip, grasp an end of the band in each hand. Straighten your elbows and lift your hands straight in front of you at shoulder height. Step back away from the secured end of band, until it becomes tense.  Step 1:  Squeeze your shoulder blades together. Bending your elbows, draw your hands to your chest as if you are rowing a boat. At the end of this motion, your hands and elbow should be at shoulder height and your elbows should be out to your sides.  Step 2: Rotate your shoulder to raise your hands above your head. Your forearms should be vertical and your upper arms should be horizontal.  Hold for __________ seconds. Slowly ease the tension on the band, as you reverse the directions and return to the starting position. Repeat __________ times. Complete this exercise __________ times per day.  POSTURE AND BODY MECHANICS CONSIDERATIONS - Mid-Back Strain Keeping correct posture when sitting, standing or completing your activities will reduce the stress put on different body tissues, allowing injured tissues a chance to heal and limiting painful experiences. The following are general guidelines for improved posture. Your physician or physical therapist will provide you with any instructions specific to your needs. While reading these guidelines, remember:  The exercises prescribed by your provider will help you have the flexibility and strength to maintain correct postures.  The correct posture provides the best environment for your joints to work. All of your joints have less wear and tear when properly supported by a spine with good posture. This means you will experience a healthier, less painful body.  Correct posture must be practiced with all of your activities, especially prolonged sitting and standing. Correct posture is as important when doing repetitive low-stress activities (typing) as it is when doing a single heavy-load activity (lifting). PROPER SITTING POSTURE In order to minimize stress and discomfort on your spine, you must sit with correct posture. Sitting with good posture should be effortless for a healthy body. Returning to good posture is a gradual process. Many people can work toward this  most comfortably by using various supports  until they have the flexibility and strength to maintain this posture on their own. When sitting with proper posture, your ears will fall over your shoulders and your shoulders will fall over your hips. You should use the back of the chair to support your upper back. Your lower back will be in a neutral position, just slightly arched. You may place a small pillow or folded towel at the base of your low back for  support.  When working at a desk, create an environment that supports good, upright posture. Without extra support, muscles fatigue and lead to excessive strain on joints and other tissues. Keep these recommendations in mind: CHAIR:  A chair should be able to slide under your desk when your back makes contact with the back of the chair. This allows you to work closely.  The chair's height should allow your eyes to be level with the upper part of your monitor and your hands to be slightly lower than your elbows. BODY POSITION  Your feet should make contact with the floor. If this is not possible, use a foot rest.  Keep your ears over your shoulders. This will reduce stress on your neck and lower back. INCORRECT SITTING POSTURES If you are feeling tired and unable to assume a healthy sitting posture, do not slouch or slump. This puts excessive strain on your back tissues, causing more damage and pain. Healthier options include:  Using more support, like a lumbar pillow.  Switching tasks to something that requires you to be upright or walking.  Talking a brief walk.  Lying down to rest in a neutral-spine position. CORRECT STANDING POSTURES Proper standing posture should be assumed with all daily activities, even if they only take a few moments, like when brushing your teeth. As in sitting, your ears should fall over your shoulders and your shoulders should fall over your hips. You should keep a slight tension in your abdominal muscles to brace  your spine. Your tailbone should point down to the ground, not behind your body, resulting in an over-extended swayback posture.  INCORRECT STANDING POSTURES Common incorrect standing postures include a forward head, locked knees, and an excessive swayback. WALKING Walk with an upright posture. Your ears, shoulders and hips should all line-up. CORRECT LIFTING TECHNIQUES DO :   Assume a wide stance. This will provide you more stability and the opportunity to get as close as possible to the object which you are lifting.  Tense your abdominals to brace your spine. Bend at the knees and hips. Keeping your back locked in a neutral-spine position, lift using your leg muscles. Lift with your legs, keeping your back straight.  Test the weight of unknown objects before attempting to lift them.  Try to keep your elbows locked down at your sides in order get the best strength from your shoulders when carrying an object.  Always ask for help when lifting heavy or awkward objects. INCORRECT LIFTING TECHNIQUES DO NOT:   Lock your knees when lifting, even if it is a small object.  Bend and twist. Pivot at your feet or move your feet when needing to change directions.  Assume that you can safely pick up even a paperclip without proper posture. Document Released: 09/03/2005 Document Revised: 01/18/2014 Document Reviewed: 12/16/2008 [facility] Patient Information 2015 [place], [place]. This information is not intended to replace advice given to you by your health care provider. Make sure you discuss any questions you have with your health care provider. Low Back Strain with  Rehab A strain is an injury in which a tendon or muscle is torn. The muscles and tendons of the lower back are vulnerable to strains. However, these muscles and tendons are very strong and require a great force to be injured. Strains are classified into three categories. Grade 1 strains cause pain, but the tendon is not lengthened. Grade  2 strains include a lengthened ligament, due to the ligament being stretched or partially ruptured. With grade 2 strains there is still function, although the function may be decreased. Grade 3 strains involve a complete tear of the tendon or muscle, and function is usually impaired. SYMPTOMS   Pain in the lower back.  Pain that affects one side more than the other.  Pain that gets worse with movement and may be felt in the hip, buttocks, or back of the thigh.  Muscle spasms of the muscles in the back.  Swelling along the muscles of the back.  Loss of strength of the back muscles.  Crackling sound (crepitation) when the muscles are touched. CAUSES  Lower back strains occur when a force is placed on the muscles or tendons that is greater than they can handle. Common causes of injury include:  Prolonged overuse of the muscle-tendon units in the lower back, usually from incorrect posture.  A single violent injury or force applied to the back. RISK INCREASES WITH:  Sports that involve twisting forces on the spine or a lot of bending at the waist (football, rugby, weightlifting, bowling, golf, tennis, speed skating, racquetball, swimming, running, gymnastics, diving).  Poor strength and flexibility.  Failure to warm up properly before activity.  Family history of lower back pain or disk disorders.  Previous back injury or surgery (especially fusion).  Poor posture with lifting, especially heavy objects.  Prolonged sitting, especially with poor posture. PREVENTION   Learn and use proper posture when sitting or lifting (maintain proper posture when sitting, lift using the knees and legs, not at the waist).  Warm up and stretch properly before activity.  Allow for adequate recovery between workouts.  Maintain physical fitness:  Strength, flexibility, and endurance.  Cardiovascular fitness. PROGNOSIS  If treated properly, lower back strains usually heal within 6  weeks. RELATED COMPLICATIONS   Recurring symptoms, resulting in a chronic problem.  Chronic inflammation, scarring, and partial muscle-tendon tear.  Delayed healing or resolution of symptoms.  Prolonged disability. TREATMENT  Treatment first involves the use of ice and medicine, to reduce pain and inflammation. The use of strengthening and stretching exercises may help reduce pain with activity. These exercises may be performed at home or with a therapist. Severe injuries may require referral to a therapist for further evaluation and treatment, such as ultrasound. Your caregiver may advise that you wear a back brace or corset, to help reduce pain and discomfort. Often, prolonged bed rest results in greater harm then benefit. Corticosteroid injections may be recommended. However, these should be reserved for the most serious cases. It is important to avoid using your back when lifting objects. At night, sleep on your back on a firm mattress with a pillow placed under your knees. If non-surgical treatment is unsuccessful, surgery may be needed.  MEDICATION   If pain medicine is needed, nonsteroidal anti-inflammatory medicines (aspirin and ibuprofen), or other minor pain relievers (acetaminophen), are often advised.  Do not take pain medicine for 7 days before surgery.  Prescription pain relievers may be given, if your caregiver thinks they are needed. Use only as directed and only  as much as you need.  Ointments applied to the skin may be helpful.  Corticosteroid injections may be given by your caregiver. These injections should be reserved for the most serious cases, because they may only be given a certain number of times. HEAT AND COLD  Cold treatment (icing) should be applied for 10 to 15 minutes every 2 to 3 hours for inflammation and pain, and immediately after activity that aggravates your symptoms. Use ice packs or an ice massage.  Heat treatment may be used before performing  stretching and strengthening activities prescribed by your caregiver, physical therapist, or athletic trainer. Use a heat pack or a warm water soak. SEEK MEDICAL CARE IF:   Symptoms get worse or do not improve in 2 to 4 weeks, despite treatment.  You develop numbness, weakness, or loss of bowel or bladder function.  New, unexplained symptoms develop. (Drugs used in treatment may produce side effects.) EXERCISES  RANGE OF MOTION (ROM) AND STRETCHING EXERCISES - Low Back Strain Most people with lower back pain will find that their symptoms get worse with excessive bending forward (flexion) or arching at the lower back (extension). The exercises which will help resolve your symptoms will focus on the opposite motion.  Your physician, physical therapist or athletic trainer will help you determine which exercises will be most helpful to resolve your lower back pain. Do not complete any exercises without first consulting with your caregiver. Discontinue any exercises which make your symptoms worse until you speak to your caregiver.  If you have pain, numbness or tingling which travels down into your buttocks, leg or foot, the goal of the therapy is for these symptoms to move closer to your back and eventually resolve. Sometimes, these leg symptoms will get better, but your lower back pain may worsen. This is typically an indication of progress in your rehabilitation. Be very alert to any changes in your symptoms and the activities in which you participated in the 24 hours prior to the change. Sharing this information with your caregiver will allow him/her to most efficiently treat your condition.  These exercises may help you when beginning to rehabilitate your injury. Your symptoms may resolve with or without further involvement from your physician, physical therapist or athletic trainer. While completing these exercises, remember:  Restoring tissue flexibility helps normal motion to return to the joints.  This allows healthier, less painful movement and activity.  An effective stretch should be held for at least 30 seconds.  A stretch should never be painful. You should only feel a gentle lengthening or release in the stretched tissue. FLEXION RANGE OF MOTION AND STRETCHING EXERCISES: STRETCH - Flexion, Single Knee to Chest   Lie on a firm bed or floor with both legs extended in front of you.  Keeping one leg in contact with the floor, bring your opposite knee to your chest. Hold your leg in place by either grabbing behind your thigh or at your knee.  Pull until you feel a gentle stretch in your lower back. Hold __________ seconds.  Slowly release your grasp and repeat the exercise with the opposite side. Repeat __________ times. Complete this exercise __________ times per day.  STRETCH - Flexion, Double Knee to Chest   Lie on a firm bed or floor with both legs extended in front of you.  Keeping one leg in contact with the floor, bring your opposite knee to your chest.  Tense your stomach muscles to support your back and then lift your other  knee to your chest. Hold your legs in place by either grabbing behind your thighs or at your knees.  Pull both knees toward your chest until you feel a gentle stretch in your lower back. Hold __________ seconds.  Tense your stomach muscles and slowly return one leg at a time to the floor. Repeat __________ times. Complete this exercise __________ times per day.  STRETCH - Low Trunk Rotation  Lie on a firm bed or floor. Keeping your legs in front of you, bend your knees so they are both pointed toward the ceiling and your feet are flat on the floor.  Extend your arms out to the side. This will stabilize your upper body by keeping your shoulders in contact with the floor.  Gently and slowly drop both knees together to one side until you feel a gentle stretch in your lower back. Hold for __________ seconds.  Tense your stomach muscles to support  your lower back as you bring your knees back to the starting position. Repeat the exercise to the other side. Repeat __________ times. Complete this exercise __________ times per day  EXTENSION RANGE OF MOTION AND FLEXIBILITY EXERCISES: STRETCH - Extension, Prone on Elbows   Lie on your stomach on the floor, a bed will be too soft. Place your palms about shoulder width apart and at the height of your head.  Place your elbows under your shoulders. If this is too painful, stack pillows under your chest.  Allow your body to relax so that your hips drop lower and make contact more completely with the floor.  Hold this position for __________ seconds.  Slowly return to lying flat on the floor. Repeat __________ times. Complete this exercise __________ times per day.  RANGE OF MOTION - Extension, Prone Press Ups  Lie on your stomach on the floor, a bed will be too soft. Place your palms about shoulder width apart and at the height of your head.  Keeping your back as relaxed as possible, slowly straighten your elbows while keeping your hips on the floor. You may adjust the placement of your hands to maximize your comfort. As you gain motion, your hands will come more underneath your shoulders.  Hold this position __________ seconds.  Slowly return to lying flat on the floor. Repeat __________ times. Complete this exercise __________ times per day.  RANGE OF MOTION- Quadruped, Neutral Spine   Assume a hands and knees position on a firm surface. Keep your hands under your shoulders and your knees under your hips. You may place padding under your knees for comfort.  Drop your head and point your tail bone toward the ground below you. This will round out your lower back like an angry cat. Hold this position for __________ seconds.  Slowly lift your head and release your tail bone so that your back sags into a large arch, like an old horse.  Hold this position for __________ seconds.  Repeat  this until you feel limber in your lower back.  Now, find your "sweet spot." This will be the most comfortable position somewhere between the two previous positions. This is your neutral spine. Once you have found this position, tense your stomach muscles to support your lower back.  Hold this position for __________ seconds. Repeat __________ times. Complete this exercise __________ times per day.  STRENGTHENING EXERCISES - Low Back Strain These exercises may help you when beginning to rehabilitate your injury. These exercises should be done near your "sweet spot." This is  the neutral, low-back arch, somewhere between fully rounded and fully arched, that is your least painful position. When performed in this safe range of motion, these exercises can be used for people who have either a flexion or extension based injury. These exercises may resolve your symptoms with or without further involvement from your physician, physical therapist or athletic trainer. While completing these exercises, remember:   Muscles can gain both the endurance and the strength needed for everyday activities through controlled exercises.  Complete these exercises as instructed by your physician, physical therapist or athletic trainer. Increase the resistance and repetitions only as guided.  You may experience muscle soreness or fatigue, but the pain or discomfort you are trying to eliminate should never worsen during these exercises. If this pain does worsen, stop and make certain you are following the directions exactly. If the pain is still present after adjustments, discontinue the exercise until you can discuss the trouble with your caregiver. STRENGTHENING - Deep Abdominals, Pelvic Tilt  Lie on a firm bed or floor. Keeping your legs in front of you, bend your knees so they are both pointed toward the ceiling and your feet are flat on the floor.  Tense your lower abdominal muscles to press your lower back into the  floor. This motion will rotate your pelvis so that your tail bone is scooping upwards rather than pointing at your feet or into the floor.  With a gentle tension and even breathing, hold this position for __________ seconds. Repeat __________ times. Complete this exercise __________ times per day.  STRENGTHENING - Abdominals, Crunches   Lie on a firm bed or floor. Keeping your legs in front of you, bend your knees so they are both pointed toward the ceiling and your feet are flat on the floor. Cross your arms over your chest.  Slightly tip your chin down without bending your neck.  Tense your abdominals and slowly lift your trunk high enough to just clear your shoulder blades. Lifting higher can put excessive stress on the lower back and does not further strengthen your abdominal muscles.  Control your return to the starting position. Repeat __________ times. Complete this exercise __________ times per day.  STRENGTHENING - Quadruped, Opposite UE/LE Lift   Assume a hands and knees position on a firm surface. Keep your hands under your shoulders and your knees under your hips. You may place padding under your knees for comfort.  Find your neutral spine and gently tense your abdominal muscles so that you can maintain this position. Your shoulders and hips should form a rectangle that is parallel with the floor and is not twisted.  Keeping your trunk steady, lift your right hand no higher than your shoulder and then your left leg no higher than your hip. Make sure you are not holding your breath. Hold this position __________ seconds.  Continuing to keep your abdominal muscles tense and your back steady, slowly return to your starting position. Repeat with the opposite arm and leg. Repeat __________ times. Complete this exercise __________ times per day.  STRENGTHENING - Lower Abdominals, Double Knee Lift  Lie on a firm bed or floor. Keeping your legs in front of you, bend your knees so they  are both pointed toward the ceiling and your feet are flat on the floor.  Tense your abdominal muscles to brace your lower back and slowly lift both of your knees until they come over your hips. Be certain not to hold your breath.  Hold __________  seconds. Using your abdominal muscles, return to the starting position in a slow and controlled manner. Repeat __________ times. Complete this exercise __________ times per day.  POSTURE AND BODY MECHANICS CONSIDERATIONS - Low Back Strain Keeping correct posture when sitting, standing or completing your activities will reduce the stress put on different body tissues, allowing injured tissues a chance to heal and limiting painful experiences. The following are general guidelines for improved posture. Your physician or physical therapist will provide you with any instructions specific to your needs. While reading these guidelines, remember:  The exercises prescribed by your provider will help you have the flexibility and strength to maintain correct postures.  The correct posture provides the best environment for your joints to work. All of your joints have less wear and tear when properly supported by a spine with good posture. This means you will experience a healthier, less painful body.  Correct posture must be practiced with all of your activities, especially prolonged sitting and standing. Correct posture is as important when doing repetitive low-stress activities (typing) as it is when doing a single heavy-load activity (lifting). RESTING POSITIONS Consider which positions are most painful for you when choosing a resting position. If you have pain with flexion-based activities (sitting, bending, stooping, squatting), choose a position that allows you to rest in a less flexed posture. You would want to avoid curling into a fetal position on your side. If your pain worsens with extension-based activities (prolonged standing, working overhead), avoid  resting in an extended position such as sleeping on your stomach. Most people will find more comfort when they rest with their spine in a more neutral position, neither too rounded nor too arched. Lying on a non-sagging bed on your side with a pillow between your knees, or on your back with a pillow under your knees will often provide some relief. Keep in mind, being in any one position for a prolonged period of time, no matter how correct your posture, can still lead to stiffness. PROPER SITTING POSTURE In order to minimize stress and discomfort on your spine, you must sit with correct posture. Sitting with good posture should be effortless for a healthy body. Returning to good posture is a gradual process. Many people can work toward this most comfortably by using various supports until they have the flexibility and strength to maintain this posture on their own. When sitting with proper posture, your ears will fall over your shoulders and your shoulders will fall over your hips. You should use the back of the chair to support your upper back. Your lower back will be in a neutral position, just slightly arched. You may place a small pillow or folded towel at the base of your lower back for support.  When working at a desk, create an environment that supports good, upright posture. Without extra support, muscles tire, which leads to excessive strain on joints and other tissues. Keep these recommendations in mind: CHAIR:  A chair should be able to slide under your desk when your back makes contact with the back of the chair. This allows you to work closely.  The chair's height should allow your eyes to be level with the upper part of your monitor and your hands to be slightly lower than your elbows. BODY POSITION  Your feet should make contact with the floor. If this is not possible, use a foot rest.  Keep your ears over your shoulders. This will reduce stress on your neck and lower back. INCORRECT  SITTING POSTURES  If you are feeling tired and unable to assume a healthy sitting posture, do not slouch or slump. This puts excessive strain on your back tissues, causing more damage and pain. Healthier options include:  Using more support, like a lumbar pillow.  Switching tasks to something that requires you to be upright or walking.  Talking a brief walk.  Lying down to rest in a neutral-spine position. PROLONGED STANDING WHILE SLIGHTLY LEANING FORWARD  When completing a task that requires you to lean forward while standing in one place for a long time, place either foot up on a stationary 2-4 inch high object to help maintain the best posture. When both feet are on the ground, the lower back tends to lose its slight inward curve. If this curve flattens (or becomes too large), then the back and your other joints will experience too much stress, tire more quickly, and can cause pain. CORRECT STANDING POSTURES Proper standing posture should be assumed with all daily activities, even if they only take a few moments, like when brushing your teeth. As in sitting, your ears should fall over your shoulders and your shoulders should fall over your hips. You should keep a slight tension in your abdominal muscles to brace your spine. Your tailbone should point down to the ground, not behind your body, resulting in an over-extended swayback posture.  INCORRECT STANDING POSTURES  Common incorrect standing postures include a forward head, locked knees and/or an excessive swayback. WALKING Walk with an upright posture. Your ears, shoulders and hips should all line-up. PROLONGED ACTIVITY IN A FLEXED POSITION When completing a task that requires you to bend forward at your waist or lean over a low surface, try to find a way to stabilize 3 out of 4 of your limbs. You can place a hand or elbow on your thigh or rest a knee on the surface you are reaching across. This will provide you more stability so that your  muscles do not fatigue as quickly. By keeping your knees relaxed, or slightly bent, you will also reduce stress across your lower back. CORRECT LIFTING TECHNIQUES DO :   Assume a wide stance. This will provide you more stability and the opportunity to get as close as possible to the object which you are lifting.  Tense your abdominals to brace your spine. Bend at the knees and hips. Keeping your back locked in a neutral-spine position, lift using your leg muscles. Lift with your legs, keeping your back straight.  Test the weight of unknown objects before attempting to lift them.  Try to keep your elbows locked down at your sides in order get the best strength from your shoulders when carrying an object.  Always ask for help when lifting heavy or awkward objects. INCORRECT LIFTING TECHNIQUES DO NOT:   Lock your knees when lifting, even if it is a small object.  Bend and twist. Pivot at your feet or move your feet when needing to change directions.  Assume that you can safely pick up even a paper clip without proper posture. Document Released: 09/03/2005 Document Revised: 11/26/2011 Document Reviewed: 12/16/2008 [facility] Patient Information 2015 [place], [place]. This information is not intended to replace advice given to you by your health care provider. Make sure you discuss any questions you have with your health care provider.

## 2015-04-04 ENCOUNTER — Encounter: Payer: Self-pay | Admitting: Emergency Medicine

## 2015-04-04 ENCOUNTER — Ambulatory Visit
Admission: EM | Admit: 2015-04-04 | Discharge: 2015-04-04 | Disposition: A | Discharge status: Home / Self Care | Payer: Worker's Compensation | Attending: Internal Medicine | Admitting: Internal Medicine

## 2015-04-04 DIAGNOSIS — S76012D Strain of muscle, fascia and tendon of left hip, subsequent encounter: Secondary | ICD-10-CM | POA: Diagnosis not present

## 2015-04-04 DIAGNOSIS — S46912D Strain of unspecified muscle, fascia and tendon at shoulder and upper arm level, left arm, subsequent encounter: Secondary | ICD-10-CM | POA: Diagnosis not present

## 2015-04-04 DIAGNOSIS — T148 Other injury of unspecified body region: Secondary | ICD-10-CM | POA: Diagnosis not present

## 2015-04-04 DIAGNOSIS — T148XXA Other injury of unspecified body region, initial encounter: Secondary | ICD-10-CM

## 2015-04-04 DIAGNOSIS — S39012D Strain of muscle, fascia and tendon of lower back, subsequent encounter: Secondary | ICD-10-CM | POA: Diagnosis not present

## 2015-04-04 MED ORDER — METAXALONE 800 MG PO TABS: 800.0000 mg | ORAL_TABLET | Freq: Three times a day (TID) | ORAL | Status: DC

## 2015-04-04 MED ORDER — ACETAMINOPHEN 500 MG PO TABS: 1000.0000 mg | ORAL_TABLET | Freq: Four times a day (QID) | ORAL | Status: DC | PRN

## 2015-04-04 NOTE — ED Notes (Signed)
Patient here for a worker's comp follow-up visit.  Patient reports some improvement in her L hip pain.

## 2015-04-04 NOTE — ED Provider Notes (Signed)
CSN: [id number]     Arrival date & time 04/04/15  1139 History   First MD Initiated Contact with Patient 04/04/15 1309     Chief Complaint  Patient presents with  . Worker's Comp Follow-up Visit    (Consider location/radiation/quality/duration/timing/severity/associated sxs/prior Treatment) HPI Comments: African [place] female here for follow up visit s/p fall at work 22 Mar 2015 [employer] works for [employer] last seen one week ago.  Pain slightly decreased shoulder, hip, low back.  Patient reported still having muscle spasms taking skelaxin TID needs refills.  Reported improved somewhat pain and would like a different medication than toradol as constipation/nausea/chest tightness with use (like I used to have before an asthma attack)  Resolved after stopping toradol last week.  Noticed large bruise on left hip next day.  Still having left shoulder pain with lifting e.g. Laundry basket. Hx left wrist tendonitis still wearing wrist splint/having pain.  Neck muscles/scapular muscles tight/spasms.   Denied headache, vomiting, loss of bowel/bladder control, saddle paresthesias, arm/leg weakness.     The history is provided by the patient.    Past Medical History  Diagnosis Date  . Asthma   . Hypertension   . Heart murmur    Past Surgical History  Procedure Laterality Date  . Abdominal hysterectomy     History reviewed. No pertinent family history. History  Substance Use Topics  . Smoking status: Never Smoker   . Smokeless tobacco: Not on file  . Alcohol Use: No   OB History    No data available     Review of Systems  Constitutional: Negative for fever, chills, diaphoresis, activity change, appetite change and fatigue.  HENT: Negative for congestion, dental problem, drooling, ear discharge, ear pain, facial swelling, hearing loss, mouth sores, nosebleeds, postnasal drip, rhinorrhea, sore throat, trouble swallowing and voice change.   Eyes: Negative for  photophobia, pain, discharge, redness, itching and visual disturbance.  Respiratory: Negative for cough, chest tightness, shortness of breath, wheezing and stridor.   Cardiovascular: Negative for chest pain, palpitations and leg swelling.  Gastrointestinal: Positive for nausea, abdominal pain and constipation. Negative for vomiting, diarrhea, blood in stool, abdominal distention, anal bleeding and rectal pain.  Endocrine: Negative for cold intolerance and heat intolerance.  Genitourinary: Negative for dysuria, hematuria and enuresis.  Musculoskeletal: Positive for myalgias, back pain, arthralgias and neck pain. Negative for joint swelling, gait problem and neck stiffness.  Skin: Negative for color change, pallor, rash and wound.  Allergic/Immunologic: Negative for environmental allergies and food allergies.  Neurological: Negative for dizziness, tremors, facial asymmetry, weakness and headaches.  Hematological: Negative for adenopathy. Does not bruise/bleed easily.  Psychiatric/Behavioral: Negative for behavioral problems, confusion, sleep disturbance and agitation.    Allergies  Codeine; Penicillins; Percocet; Sulfa antibiotics; and Vicodin  Home Medications   Prior to Admission medications   Medication Sig Start Date End Date Taking? Authorizing Provider  acetaminophen (TYLENOL) 500 MG tablet Take 2 tablets (1,000 mg total) by mouth every 6 (six) hours as needed for moderate pain. 04/04/15   Barbaraann Barthel, NP  albuterol (PROVENTIL HFA;VENTOLIN HFA) 108 (90 BASE) MCG/ACT inhaler Inhale into the lungs every 6 (six) hours as needed for wheezing or shortness of breath.    Historical Provider, MD  lisinopril (PRINIVIL,ZESTRIL) 10 MG tablet Take 10 mg by mouth daily.    Historical Provider, MD  metaxalone (SKELAXIN) 800 MG tablet Take 1 tablet (800 mg total) by mouth 3 (three) times daily. 04/04/15   Inetta Fermo A  Betancourt, NP   BP 193/85 mmHg  Pulse 60  Temp(Src) 97.9 F (36.6 C) (Tympanic)   Resp 16  SpO2 100% Physical Exam  Constitutional: She is oriented to person, place, and time. Vital signs are normal. She appears well-developed and well-nourished. No distress.  HENT:  Head: Normocephalic and atraumatic.  Right Ear: External ear normal.  Left Ear: External ear normal.  Nose: Nose normal.  Mouth/Throat: Oropharynx is clear and moist.  Eyes: Conjunctivae, EOM and lids are normal. Pupils are equal, round, and reactive to light. Right eye exhibits no discharge. Left eye exhibits no discharge. No scleral icterus.  Neck: Trachea normal and normal range of motion. Neck supple. No tracheal deviation present. No thyromegaly present.  Cardiovascular: Normal rate, regular rhythm, normal heart sounds and intact distal pulses.  Exam reveals no gallop and no friction rub.   No murmur heard. Pulmonary/Chest: Effort normal and breath sounds normal. No stridor. No respiratory distress. She has no wheezes. She has no rales. She exhibits no tenderness.  Abdominal: Soft. She exhibits no distension.  Musculoskeletal: Normal range of motion. She exhibits tenderness. She exhibits no edema.       Right shoulder: Normal.       Left shoulder: She exhibits tenderness. She exhibits normal range of motion, no bony tenderness, no swelling, no effusion, no crepitus, no deformity, no laceration, no pain, no spasm, normal pulse and normal strength.       Right elbow: Normal.      Left elbow: Normal.       Right hip: Normal.       Left hip: Normal.       Cervical back: She exhibits pain. She exhibits normal range of motion, no tenderness, no bony tenderness, no swelling, no edema, no deformity, no laceration, no spasm and normal pulse.       Right upper arm: Normal.       Left upper arm: She exhibits tenderness. She exhibits no bony tenderness, no swelling, no edema, no deformity and no laceration.       Right hand: Normal.       Left hand: Normal.  TTP proximal biceps tendon left; right trapezius muscle  tight, left scapular muscle and bilateral paraspinals T12; full arom but pain with left atchley scratch; forward flexion greater than 90 degrees low back and left hip  Lymphadenopathy:    She has no cervical adenopathy.  Neurological: She is alert and oriented to person, place, and time. She has normal reflexes. She displays no atrophy, no tremor and normal reflexes. No cranial nerve deficit or sensory deficit. She exhibits normal muscle tone. She displays no seizure activity. Coordination and gait normal.  Reflex Scores:      Patellar reflexes are 2+ on the right side and 2+ on the left side.      Achilles reflexes are 2+ on the right side and 2+ on the left side. Normal heel toe gait; movements smoother and faster than last week  Skin: Skin is warm, dry and intact. No rash noted. She is not diaphoretic. No erythema. No pallor.  Psychiatric: She has a normal mood and affect. Her speech is normal and behavior is normal. Judgment and thought content normal. Cognition and memory are normal.  Nursing note and vitals reviewed.   ED Course  Procedures (including critical care time) Labs Review Labs Reviewed - No data to display  Imaging Review No results found.   MDM   1. Shoulder strain, left, subsequent encounter  2. Contusion   3. Hip strain, left, subsequent encounter   4. Low back strain, subsequent encounter    Plan: 1. Test/x-ray results and diagnosis reviewed with patient 2. rx as per orders; risks, benefits, potential side effects reviewed with patient 3. Recommend supportive treatment with  4. F/u prn if symptoms worsen or don't improve  For acute pain, rest, and intermittent application of heat (do not sleep on heating pad). I discussed longer-term treatment plan of PRN PO NSAIDS stop toradol and start tylenol and I discussed a home back care exercise program with a strengthening and flexibility exercise. Patient given Exitcare handout on back strain with rehab exercises.  Refilled skelaxin using TID patient reported working better than flexeril for her no sedation. Avoid operation of dangerous machinery and alcohol intake while on skelaxin. Avoid dehydration take tylenol 1000mg  po at least twice per day if still having pain may take up to QID. Proper avoidance of heavy lifting (greater than 25 lbs) discussed. Avoid impact activities to left hand/arm. Consider physical therapy or chiropractic care and radiology if not improving. Call or return to clinic as needed if these symptoms worsen or fail to improve as anticipated especially leg weakness, loss of bowel/bladder control or saddle paresthesias. Patient verbalized understanding of instructions/information and agreed with plan of care.  P2: Injury Prevention, fitness  Patient was instructed to rest, ice and elevate arm. Exitcare handout on wrist pain given to patient. Medications as directed. Avoid impact to left hand. Work note with restrictions given to patient. Call or return to clinic as needed if these symptoms worsen or fail to improve as anticipated and will consider orthopedics evaluation. Patient verbalized agreement and understanding of treatment plan. P2: ROM, injury prevention  Patient given work note with restrictions. Patient was instructed to rest, ice, and ROM exercises. Activity as tolerated. Patient is to take OTC po NSAIDS as needed. Follow up if symptoms persist or worsen. Exitcare handout on hip pain given to patient. Follow up in 1 week for re-evaluation. Patient verbalized agreement and understanding of treatment plan. P2: Injury Prevention and Fitness.      Barbaraann Barthel, NP 04/04/15 1356

## 2015-04-04 NOTE — Discharge Instructions (Signed)
Hip Pain Your hip is the joint between your upper legs and your lower pelvis. The bones, cartilage, tendons, and muscles of your hip joint perform a lot of work each day supporting your body weight and allowing you to move around. Hip pain can range from a minor ache to severe pain in one or both of your hips. Pain may be felt on the inside of the hip joint near the groin, or the outside near the buttocks and upper thigh. You may have swelling or stiffness as well.  HOME CARE INSTRUCTIONS  Take medicines only as directed by your health care provider. Apply ice to the injured area: Put ice in a plastic bag. Place a towel between your skin and the bag. Leave the ice on for 15-20 minutes at a time, 3-4 times a day. Keep your leg raised (elevated) when possible to lessen swelling. Avoid activities that cause pain. Follow specific exercises as directed by your health care provider. Sleep with a pillow between your legs on your most comfortable side. Record how often you have hip pain, the location of the pain, and what it feels like. SEEK MEDICAL CARE IF:  You are unable to put weight on your leg. Your hip is red or swollen or very tender to touch. Your pain or swelling continues or worsens after 1 week. You have increasing difficulty walking. You have a fever. SEEK IMMEDIATE MEDICAL CARE IF:  You have fallen. You have a sudden increase in pain and swelling in your hip. MAKE SURE YOU:  Understand these instructions. Will watch your condition. Will get help right away if you are not doing well or get worse. Document Released: 02/21/2010 Document Revised: 01/18/2014 Document Reviewed: 04/30/2013 [facility] Patient Information 2015 [place], [place]. This information is not intended to replace advice given to you by your health care provider. Make sure you discuss any questions you have with your health care provider. Contusion A contusion is a deep bruise. Contusions are the result of an injury  that caused bleeding under the skin. The contusion may turn blue, purple, or yellow. Minor injuries will give you a painless contusion, but more severe contusions may stay painful and swollen for a few weeks.  CAUSES  A contusion is usually caused by a blow, trauma, or direct force to an area of the body. SYMPTOMS   Swelling and redness of the injured area.  Bruising of the injured area.  Tenderness and soreness of the injured area.  Pain. DIAGNOSIS  The diagnosis can be made by taking a history and physical exam. An X-ray, CT scan, or MRI may be needed to determine if there were any associated injuries, such as fractures. TREATMENT  Specific treatment will depend on what area of the body was injured. In general, the best treatment for a contusion is resting, icing, elevating, and applying cold compresses to the injured area. Over-the-counter medicines may also be recommended for pain control. Ask your caregiver what the best treatment is for your contusion. HOME CARE INSTRUCTIONS   Put ice on the injured area.  Put ice in a plastic bag.  Place a towel between your skin and the bag.  Leave the ice on for 15-20 minutes, 3-4 times a day, or as directed by your health care provider.  Only take over-the-counter or prescription medicines for pain, discomfort, or fever as directed by your caregiver. Your caregiver may recommend avoiding anti-inflammatory medicines (aspirin, ibuprofen, and naproxen) for 48 hours because these medicines may increase bruising.  Rest the injured area.  If possible, elevate the injured area to reduce swelling. SEEK IMMEDIATE MEDICAL CARE IF:   You have increased bruising or swelling.  You have pain that is getting worse.  Your swelling or pain is not relieved with medicines. MAKE SURE YOU:   Understand these instructions.  Will watch your condition.  Will get help right away if you are not doing well or get worse. Document Released: 06/13/2005  Document Revised: 09/08/2013 Document Reviewed: 07/09/2011 [facility] Patient Information 2015 [place], [place]. This information is not intended to replace advice given to you by your health care provider. Make sure you discuss any questions you have with your health care provider. Shoulder Sprain A shoulder sprain is the result of damage to the tough, fiber-like tissues (ligaments) that help hold your shoulder in place. The ligaments may be stretched or torn. Besides the main shoulder joint (the ball and socket), there are several smaller joints that connect the bones in this area. A sprain usually involves one of those joints. Most often it is the acromioclavicular (or AC) joint. That is the joint that connects the collarbone (clavicle) and the shoulder blade (scapula) at the top point of the shoulder blade (acromion). A shoulder sprain is a mild form of what is called a shoulder separation. Recovering from a shoulder sprain may take some time. For some, pain lingers for several months. Most people recover without long term problems. CAUSES   A shoulder sprain is usually caused by some kind of trauma. This might be:  Falling on an outstretched arm.  Being hit hard on the shoulder.  Twisting the arm.  Shoulder sprains are more likely to occur in people who:  Play sports.  Have balance or coordination problems. SYMPTOMS   Pain when you move your shoulder.  Limited ability to move the shoulder.  Swelling and tenderness on top of the shoulder.  Redness or warmth in the shoulder.  Bruising.  A change in the shape of the shoulder. DIAGNOSIS  Your healthcare provider may:  Ask about your symptoms.  Ask about recent activity that might have caused those symptoms.  Examine your shoulder. You may be asked to do simple exercises to test movement. The other shoulder will be examined for comparison.  Order some tests that provide a look inside the body. They can show the extent of the  injury. The tests could include:  X-rays.  CT (computed tomography) scan.  MRI (magnetic resonance imaging) scan. RISKS AND COMPLICATIONS  Loss of full shoulder motion.  Ongoing shoulder pain. TREATMENT  How long it takes to recover from a shoulder sprain depends on how severe it was. Treatment options may include:  Rest. You should not use the arm or shoulder until it heals.  Ice. For 2 or 3 days after the injury, put an ice pack on the shoulder up to 4 times a day. It should stay on for 15 to 20 minutes each time. Wrap the ice in a towel so it does not touch your skin.  Over-the-counter medicine to relieve pain.  A sling or brace. This will keep the arm still while the shoulder is healing.  Physical therapy or rehabilitation exercises. These will help you regain strength and motion. Ask your healthcare provider when it is OK to begin these exercises.  Surgery. The need for surgery is rare with a sprained shoulder, but some people may need surgery to keep the joint in place and reduce pain. HOME CARE INSTRUCTIONS  Ask your healthcare provider about what you should and should not do while your shoulder heals.  Make sure you know how to apply ice to the correct area of your shoulder.  Talk with your healthcare provider about which medications should be used for pain and swelling.  If rehabilitation therapy will be needed, ask your healthcare provider to refer you to a therapist. If it is not recommended, then ask about at-home exercises. Find out when exercise should begin. SEEK MEDICAL CARE IF:  Your pain, swelling, or redness at the joint increases. SEEK IMMEDIATE MEDICAL CARE IF:   You have a fever.  You cannot move your arm or shoulder. Document Released: 01/20/2009 Document Revised: 11/26/2011 Document Reviewed: 01/20/2009 [facility] Patient Information 2015 [place], [place]. This information is not intended to replace advice given to you by your health care provider.  Make sure you discuss any questions you have with your health care provider. Shoulder Exercises EXERCISES  RANGE OF MOTION (ROM) AND STRETCHING EXERCISES These exercises may help you when beginning to rehabilitate your injury. Your symptoms may resolve with or without further involvement from your physician, physical therapist or athletic trainer. While completing these exercises, remember:   Restoring tissue flexibility helps normal motion to return to the joints. This allows healthier, less painful movement and activity.  An effective stretch should be held for at least 30 seconds.  A stretch should never be painful. You should only feel a gentle lengthening or release in the stretched tissue. ROM - Pendulum  Bend at the waist so that your right / left arm falls away from your body. Support yourself with your opposite hand on a solid surface, such as a table or a countertop.  Your right / left arm should be perpendicular to the ground. If it is not perpendicular, you need to lean over farther. Relax the muscles in your right / left arm and shoulder as much as possible.  Gently sway your hips and trunk so they move your right / left arm without any use of your right / left shoulder muscles.  Progress your movements so that your right / left arm moves side to side, then forward and backward, and finally, both clockwise and counterclockwise.  Complete __________ repetitions in each direction. Many people use this exercise to relieve discomfort in their shoulder as well as to gain range of motion. Repeat __________ times. Complete this exercise __________ times per day. STRETCH - Flexion, Standing  Stand with good posture. With an underhand grip on your right / left hand and an overhand grip on the opposite hand, grasp a broomstick or cane so that your hands are a little more than shoulder-width apart.  Keeping your right / left elbow straight and shoulder muscles relaxed, push the stick  with your opposite hand to raise your right / left arm in front of your body and then overhead. Raise your arm until you feel a stretch in your right / left shoulder, but before you have increased shoulder pain.  Try to avoid shrugging your right / left shoulder as your arm rises by keeping your shoulder blade tucked down and toward your mid-back spine. Hold __________ seconds.  Slowly return to the starting position. Repeat __________ times. Complete this exercise __________ times per day. STRETCH - Internal Rotation  Place your right / left hand behind your back, palm-up.  Throw a towel or belt over your opposite shoulder. Grasp the towel/belt with your right / left hand.  While keeping an  upright posture, gently pull up on the towel/belt until you feel a stretch in the front of your right / left shoulder.  Avoid shrugging your right / left shoulder as your arm rises by keeping your shoulder blade tucked down and toward your mid-back spine.  Hold __________. Release the stretch by lowering your opposite hand. Repeat __________ times. Complete this exercise __________ times per day. STRETCH - External Rotation and Abduction  Stagger your stance through a doorframe. It does not matter which foot is forward.  As instructed by your physician, physical therapist or athletic trainer, place your hands:  And forearms above your head and on the door frame.  And forearms at head-height and on the door frame.  At elbow-height and on the door frame.  Keeping your head and chest upright and your stomach muscles tight to prevent over-extending your low-back, slowly shift your weight onto your front foot until you feel a stretch across your chest and/or in the front of your shoulders.  Hold __________ seconds. Shift your weight to your back foot to release the stretch. Repeat __________ times. Complete this stretch __________ times per day.  STRENGTHENING EXERCISES  These exercises may help you  when beginning to rehabilitate your injury. They may resolve your symptoms with or without further involvement from your physician, physical therapist or athletic trainer. While completing these exercises, remember:   Muscles can gain both the endurance and the strength needed for everyday activities through controlled exercises.  Complete these exercises as instructed by your physician, physical therapist or athletic trainer. Progress the resistance and repetitions only as guided.  You may experience muscle soreness or fatigue, but the pain or discomfort you are trying to eliminate should never worsen during these exercises. If this pain does worsen, stop and make certain you are following the directions exactly. If the pain is still present after adjustments, discontinue the exercise until you can discuss the trouble with your clinician.  If advised by your physician, during your recovery, avoid activity or exercises which involve actions that place your right / left hand or elbow above your head or behind your back or head. These positions stress the tissues which are trying to heal. STRENGTH - Scapular Depression and Adduction  With good posture, sit on a firm chair. Supported your arms in front of you with pillows, arm rests or a table top. Have your elbows in line with the sides of your body.  Gently draw your shoulder blades down and toward your mid-back spine. Gradually increase the tension without tensing the muscles along the top of your shoulders and the back of your neck.  Hold for __________ seconds. Slowly release the tension and relax your muscles completely before completing the next repetition.  After you have practiced this exercise, remove the arm support and complete it in standing as well as sitting. Repeat __________ times. Complete this exercise __________ times per day.  STRENGTH - External Rotators  Secure a rubber exercise band/tubing to a fixed object so that it is at  the same height as your right / left elbow when you are standing or sitting on a firm surface.  Stand or sit so that the secured exercise band/tubing is at your side that is not injured.  Bend your elbow 90 degrees. Place a folded towel or small pillow under your right / left arm so that your elbow is a few inches away from your side.  Keeping the tension on the exercise band/tubing, pull it away  from your body, as if pivoting on your elbow. Be sure to keep your body steady so that the movement is only coming from your shoulder rotating.  Hold __________ seconds. Release the tension in a controlled manner as you return to the starting position. Repeat __________ times. Complete this exercise __________ times per day.  STRENGTH - Supraspinatus  Stand or sit with good posture. Grasp a __________ weight or an exercise band/tubing so that your hand is "thumbs-up," like when you shake hands.  Slowly lift your right / left hand from your thigh into the air, traveling about 30 degrees from straight out at your side. Lift your hand to shoulder height or as far as you can without increasing any shoulder pain. Initially, many people do not lift their hands above shoulder height.  Avoid shrugging your right / left shoulder as your arm rises by keeping your shoulder blade tucked down and toward your mid-back spine.  Hold for __________ seconds. Control the descent of your hand as you slowly return to your starting position. Repeat __________ times. Complete this exercise __________ times per day.  STRENGTH - Shoulder Extensors  Secure a rubber exercise band/tubing so that it is at the height of your shoulders when you are either standing or sitting on a firm arm-less chair.  With a thumbs-up grip, grasp an end of the band/tubing in each hand. Straighten your elbows and lift your hands straight in front of you at shoulder height. Step back away from the secured end of band/tubing until it becomes  tense.  Squeezing your shoulder blades together, pull your hands down to the sides of your thighs. Do not allow your hands to go behind you.  Hold for __________ seconds. Slowly ease the tension on the band/tubing as you reverse the directions and return to the starting position. Repeat __________ times. Complete this exercise __________ times per day.  STRENGTH - Scapular Retractors  Secure a rubber exercise band/tubing so that it is at the height of your shoulders when you are either standing or sitting on a firm arm-less chair.  With a palm-down grip, grasp an end of the band/tubing in each hand. Straighten your elbows and lift your hands straight in front of you at shoulder height. Step back away from the secured end of band/tubing until it becomes tense.  Squeezing your shoulder blades together, draw your elbows back as you bend them. Keep your upper arm lifted away from your body throughout the exercise.  Hold __________ seconds. Slowly ease the tension on the band/tubing as you reverse the directions and return to the starting position. Repeat __________ times. Complete this exercise __________ times per day. STRENGTH - Scapular Depressors  Find a sturdy chair without wheels, such as a from a dining room table.  Keeping your feet on the floor, lift your bottom from the seat and lock your elbows.  Keeping your elbows straight, allow gravity to pull your body weight down. Your shoulders will rise toward your ears.  Raise your body against gravity by drawing your shoulder blades down your back, shortening the distance between your shoulders and ears. Although your feet should always maintain contact with the floor, your feet should progressively support less body weight as you get stronger.  Hold __________ seconds. In a controlled and slow manner, lower your body weight to begin the next repetition. Repeat __________ times. Complete this exercise __________ times per day.  Document  Released: 07/18/2005 Document Revised: 11/26/2011 Document Reviewed: 12/16/2008 [employer] Patient Information  2015 [employer]. This information is not intended to replace advice given to you by your health care provider. Make sure you discuss any questions you have with your health care provider. Low Back Sprain with Rehab  A sprain is an injury in which a ligament is torn. The ligaments of the lower back are vulnerable to sprains. However, they are strong and require great force to be injured. These ligaments are important for stabilizing the spinal column. Sprains are classified into three categories. Grade 1 sprains cause pain, but the tendon is not lengthened. Grade 2 sprains include a lengthened ligament, due to the ligament being stretched or partially ruptured. With grade 2 sprains there is still function, although the function may be decreased. Grade 3 sprains involve a complete tear of the tendon or muscle, and function is usually impaired. SYMPTOMS   Severe pain in the lower back.  Sometimes, a feeling of a "pop," "snap," or tear, at the time of injury.  Tenderness and sometimes swelling at the injury site.  Uncommonly, bruising (contusion) within 48 hours of injury.  Muscle spasms in the back. CAUSES  Low back sprains occur when a force is placed on the ligaments that is greater than they can handle. Common causes of injury include:  Performing a stressful act while off-balance.  Repetitive stressful activities that involve movement of the lower back.  Direct hit (trauma) to the lower back. RISK INCREASES WITH:  Contact sports (football, wrestling).  Collisions (major skiing accidents).  Sports that require throwing or lifting (baseball, weightlifting).  Sports involving twisting of the spine (gymnastics, diving, tennis, golf).  Poor strength and flexibility.  Inadequate protection.  Previous back injury or surgery (especially fusion). PREVENTION  Wear properly  fitted and padded protective equipment.  Warm up and stretch properly before activity.  Allow for adequate recovery between workouts.  Maintain physical fitness:  Strength, flexibility, and endurance.  Cardiovascular fitness.  Maintain a healthy body weight. PROGNOSIS  If treated properly, low back sprains usually heal with non-surgical treatment. The length of time for healing depends on the severity of the injury.  RELATED COMPLICATIONS   Recurring symptoms, resulting in a chronic problem.  Chronic inflammation and pain in the low back.  Delayed healing or resolution of symptoms, especially if activity is resumed too soon.  Prolonged impairment.  Unstable or arthritic joints of the low back. TREATMENT  Treatment first involves the use of ice and medicine, to reduce pain and inflammation. The use of strengthening and stretching exercises may help reduce pain with activity. These exercises may be performed at home or with a therapist. Severe injuries may require referral to a therapist for further evaluation and treatment, such as ultrasound. Your caregiver may advise that you wear a back brace or corset, to help reduce pain and discomfort. Often, prolonged bed rest results in greater harm then benefit. Corticosteroid injections may be recommended. However, these should be reserved for the most serious cases. It is important to avoid using your back when lifting objects. At night, sleep on your back on a firm mattress, with a pillow placed under your knees. If non-surgical treatment is unsuccessful, surgery may be needed.  MEDICATION   If pain medicine is needed, nonsteroidal anti-inflammatory medicines (aspirin and ibuprofen), or other minor pain relievers (acetaminophen), are often advised.  Do not take pain medicine for 7 days before surgery.  Prescription pain relievers may be given, if your caregiver thinks they are needed. Use only as directed and only as  much as you  need.  Ointments applied to the skin may be helpful.  Corticosteroid injections may be given by your caregiver. These injections should be reserved for the most serious cases, because they may only be given a certain number of times. HEAT AND COLD  Cold treatment (icing) should be applied for 10 to 15 minutes every 2 to 3 hours for inflammation and pain, and immediately after activity that aggravates your symptoms. Use ice packs or an ice massage.  Heat treatment may be used before performing stretching and strengthening activities prescribed by your caregiver, physical therapist, or athletic trainer. Use a heat pack or a warm water soak. SEEK MEDICAL CARE IF:   Symptoms get worse or do not improve in 2 to 4 weeks, despite treatment.  You develop numbness or weakness in either leg.  You lose bowel or bladder function.  Any of the following occur after surgery: fever, increased pain, swelling, redness, drainage of fluids, or bleeding in the affected area.  New, unexplained symptoms develop. (Drugs used in treatment may produce side effects.) EXERCISES  RANGE OF MOTION (ROM) AND STRETCHING EXERCISES - Low Back Sprain Most people with lower back pain will find that their symptoms get worse with excessive bending forward (flexion) or arching at the lower back (extension). The exercises that will help resolve your symptoms will focus on the opposite motion.  Your physician, physical therapist or athletic trainer will help you determine which exercises will be most helpful to resolve your lower back pain. Do not complete any exercises without first consulting with your caregiver. Discontinue any exercises which make your symptoms worse, until you speak to your caregiver. If you have pain, numbness or tingling which travels down into your buttocks, leg or foot, the goal of the therapy is for these symptoms to move closer to your back and eventually resolve. Sometimes, these leg symptoms will get  better, but your lower back pain may worsen. This is often an indication of progress in your rehabilitation. Be very alert to any changes in your symptoms and the activities in which you participated in the 24 hours prior to the change. Sharing this information with your caregiver will allow him or her to most efficiently treat your condition. These exercises may help you when beginning to rehabilitate your injury. Your symptoms may resolve with or without further involvement from your physician, physical therapist or athletic trainer. While completing these exercises, remember:   Restoring tissue flexibility helps normal motion to return to the joints. This allows healthier, less painful movement and activity.  An effective stretch should be held for at least 30 seconds.  A stretch should never be painful. You should only feel a gentle lengthening or release in the stretched tissue. FLEXION RANGE OF MOTION AND STRETCHING EXERCISES: STRETCH - Flexion, Single Knee to Chest   Lie on a firm bed or floor with both legs extended in front of you.  Keeping one leg in contact with the floor, bring your opposite knee to your chest. Hold your leg in place by either grabbing behind your thigh or at your knee.  Pull until you feel a gentle stretch in your low back. Hold __________ seconds.  Slowly release your grasp and repeat the exercise with the opposite side. Repeat __________ times. Complete this exercise __________ times per day.  STRETCH - Flexion, Double Knee to Chest  Lie on a firm bed or floor with both legs extended in front of you.  Keeping one leg in  contact with the floor, bring your opposite knee to your chest.  Tense your stomach muscles to support your back and then lift your other knee to your chest. Hold your legs in place by either grabbing behind your thighs or at your knees.  Pull both knees toward your chest until you feel a gentle stretch in your low back. Hold __________  seconds.  Tense your stomach muscles and slowly return one leg at a time to the floor. Repeat __________ times. Complete this exercise __________ times per day.  STRETCH - Low Trunk Rotation  Lie on a firm bed or floor. Keeping your legs in front of you, bend your knees so they are both pointed toward the ceiling and your feet are flat on the floor.  Extend your arms out to the side. This will stabilize your upper body by keeping your shoulders in contact with the floor.  Gently and slowly drop both knees together to one side until you feel a gentle stretch in your low back. Hold for __________ seconds.  Tense your stomach muscles to support your lower back as you bring your knees back to the starting position. Repeat the exercise to the other side. Repeat __________ times. Complete this exercise __________ times per day  EXTENSION RANGE OF MOTION AND FLEXIBILITY EXERCISES: STRETCH - Extension, Prone on Elbows   Lie on your stomach on the floor, a bed will be too soft. Place your palms about shoulder width apart and at the height of your head.  Place your elbows under your shoulders. If this is too painful, stack pillows under your chest.  Allow your body to relax so that your hips drop lower and make contact more completely with the floor.  Hold this position for __________ seconds.  Slowly return to lying flat on the floor. Repeat __________ times. Complete this exercise __________ times per day.  RANGE OF MOTION - Extension, Prone Press Ups  Lie on your stomach on the floor, a bed will be too soft. Place your palms about shoulder width apart and at the height of your head.  Keeping your back as relaxed as possible, slowly straighten your elbows while keeping your hips on the floor. You may adjust the placement of your hands to maximize your comfort. As you gain motion, your hands will come more underneath your shoulders.  Hold this position __________ seconds.  Slowly return to  lying flat on the floor. Repeat __________ times. Complete this exercise __________ times per day.  RANGE OF MOTION- Quadruped, Neutral Spine   Assume a hands and knees position on a firm surface. Keep your hands under your shoulders and your knees under your hips. You may place padding under your knees for comfort.  Drop your head and point your tailbone toward the ground below you. This will round out your lower back like an angry cat. Hold this position for __________ seconds.  Slowly lift your head and release your tail bone so that your back sags into a large arch, like an old horse.  Hold this position for __________ seconds.  Repeat this until you feel limber in your low back.  Now, find your "sweet spot." This will be the most comfortable position somewhere between the two previous positions. This is your neutral spine. Once you have found this position, tense your stomach muscles to support your low back.  Hold this position for __________ seconds. Repeat __________ times. Complete this exercise __________ times per day.  STRENGTHENING EXERCISES - Low  Back Sprain These exercises may help you when beginning to rehabilitate your injury. These exercises should be done near your "sweet spot." This is the neutral, low-back arch, somewhere between fully rounded and fully arched, that is your least painful position. When performed in this safe range of motion, these exercises can be used for people who have either a flexion or extension based injury. These exercises may resolve your symptoms with or without further involvement from your physician, physical therapist or athletic trainer. While completing these exercises, remember:   Muscles can gain both the endurance and the strength needed for everyday activities through controlled exercises.  Complete these exercises as instructed by your physician, physical therapist or athletic trainer. Increase the resistance and repetitions only as  guided.  You may experience muscle soreness or fatigue, but the pain or discomfort you are trying to eliminate should never worsen during these exercises. If this pain does worsen, stop and make certain you are following the directions exactly. If the pain is still present after adjustments, discontinue the exercise until you can discuss the trouble with your caregiver. STRENGTHENING - Deep Abdominals, Pelvic Tilt   Lie on a firm bed or floor. Keeping your legs in front of you, bend your knees so they are both pointed toward the ceiling and your feet are flat on the floor.  Tense your lower abdominal muscles to press your low back into the floor. This motion will rotate your pelvis so that your tail bone is scooping upwards rather than pointing at your feet or into the floor. With a gentle tension and even breathing, hold this position for __________ seconds. Repeat __________ times. Complete this exercise __________ times per day.  STRENGTHENING - Abdominals, Crunches   Lie on a firm bed or floor. Keeping your legs in front of you, bend your knees so they are both pointed toward the ceiling and your feet are flat on the floor. Cross your arms over your chest.  Slightly tip your chin down without bending your neck.  Tense your abdominals and slowly lift your trunk high enough to just clear your shoulder blades. Lifting higher can put excessive stress on the lower back and does not further strengthen your abdominal muscles.  Control your return to the starting position. Repeat __________ times. Complete this exercise __________ times per day.  STRENGTHENING - Quadruped, Opposite UE/LE Lift   Assume a hands and knees position on a firm surface. Keep your hands under your shoulders and your knees under your hips. You may place padding under your knees for comfort.  Find your neutral spine and gently tense your abdominal muscles so that you can maintain this position. Your shoulders and hips  should form a rectangle that is parallel with the floor and is not twisted.  Keeping your trunk steady, lift your right hand no higher than your shoulder and then your left leg no higher than your hip. Make sure you are not holding your breath. Hold this position for __________ seconds.  Continuing to keep your abdominal muscles tense and your back steady, slowly return to your starting position. Repeat with the opposite arm and leg. Repeat __________ times. Complete this exercise __________ times per day.  STRENGTHENING - Abdominals and Quadriceps, Straight Leg Raise   Lie on a firm bed or floor with both legs extended in front of you.  Keeping one leg in contact with the floor, bend the other knee so that your foot can rest flat on the floor.  Find your neutral spine, and tense your abdominal muscles to maintain your spinal position throughout the exercise.  Slowly lift your straight leg off the floor about 6 inches for a count of 15, making sure to not hold your breath.  Still keeping your neutral spine, slowly lower your leg all the way to the floor. Repeat this exercise with each leg __________ times. Complete this exercise __________ times per day. POSTURE AND BODY MECHANICS CONSIDERATIONS - Low Back Sprain Keeping correct posture when sitting, standing or completing your activities will reduce the stress put on different body tissues, allowing injured tissues a chance to heal and limiting painful experiences. The following are general guidelines for improved posture. Your physician or physical therapist will provide you with any instructions specific to your needs. While reading these guidelines, remember:  The exercises prescribed by your provider will help you have the flexibility and strength to maintain correct postures.  The correct posture provides the best environment for your joints to work. All of your joints have less wear and tear when properly supported by a spine with good  posture. This means you will experience a healthier, less painful body.  Correct posture must be practiced with all of your activities, especially prolonged sitting and standing. Correct posture is as important when doing repetitive low-stress activities (typing) as it is when doing a single heavy-load activity (lifting). RESTING POSITIONS Consider which positions are most painful for you when choosing a resting position. If you have pain with flexion-based activities (sitting, bending, stooping, squatting), choose a position that allows you to rest in a less flexed posture. You would want to avoid curling into a fetal position on your side. If your pain worsens with extension-based activities (prolonged standing, working overhead), avoid resting in an extended position such as sleeping on your stomach. Most people will find more comfort when they rest with their spine in a more neutral position, neither too rounded nor too arched. Lying on a non-sagging bed on your side with a pillow between your knees, or on your back with a pillow under your knees will often provide some relief. Keep in mind, being in any one position for a prolonged period of time, no matter how correct your posture, can still lead to stiffness. PROPER SITTING POSTURE In order to minimize stress and discomfort on your spine, you must sit with correct posture. Sitting with good posture should be effortless for a healthy body. Returning to good posture is a gradual process. Many people can work toward this most comfortably by using various supports until they have the flexibility and strength to maintain this posture on their own. When sitting with proper posture, your ears will fall over your shoulders and your shoulders will fall over your hips. You should use the back of the chair to support your upper back. Your lower back will be in a neutral position, just slightly arched. You may place a small pillow or folded towel at the base of  your lower back for  support.  When working at a desk, create an environment that supports good, upright posture. Without extra support, muscles tire, which leads to excessive strain on joints and other tissues. Keep these recommendations in mind: CHAIR:  A chair should be able to slide under your desk when your back makes contact with the back of the chair. This allows you to work closely.  The chair's height should allow your eyes to be level with the upper part of your monitor and  your hands to be slightly lower than your elbows. BODY POSITION  Your feet should make contact with the floor. If this is not possible, use a foot rest.  Keep your ears over your shoulders. This will reduce stress on your neck and low back. INCORRECT SITTING POSTURES  If you are feeling tired and unable to assume a healthy sitting posture, do not slouch or slump. This puts excessive strain on your back tissues, causing more damage and pain. Healthier options include:  Using more support, like a lumbar pillow.  Switching tasks to something that requires you to be upright or walking.  Talking a brief walk.  Lying down to rest in a neutral-spine position. PROLONGED STANDING WHILE SLIGHTLY LEANING FORWARD  When completing a task that requires you to lean forward while standing in one place for a long time, place either foot up on a stationary 2-4 inch high object to help maintain the best posture. When both feet are on the ground, the lower back tends to lose its slight inward curve. If this curve flattens (or becomes too large), then the back and your other joints will experience too much stress, tire more quickly, and can cause pain. CORRECT STANDING POSTURES Proper standing posture should be assumed with all daily activities, even if they only take a few moments, like when brushing your teeth. As in sitting, your ears should fall over your shoulders and your shoulders should fall over your hips. You should keep  a slight tension in your abdominal muscles to brace your spine. Your tailbone should point down to the ground, not behind your body, resulting in an over-extended swayback posture.  INCORRECT STANDING POSTURES  Common incorrect standing postures include a forward head, locked knees and/or an excessive swayback. WALKING Walk with an upright posture. Your ears, shoulders and hips should all line-up. PROLONGED ACTIVITY IN A FLEXED POSITION When completing a task that requires you to bend forward at your waist or lean over a low surface, try to find a way to stabilize 3 out of 4 of your limbs. You can place a hand or elbow on your thigh or rest a knee on the surface you are reaching across. This will provide you more stability, so that your muscles do not tire as quickly. By keeping your knees relaxed, or slightly bent, you will also reduce stress across your lower back. CORRECT LIFTING TECHNIQUES DO :  Assume a wide stance. This will provide you more stability and the opportunity to get as close as possible to the object which you are lifting.  Tense your abdominals to brace your spine. Bend at the knees and hips. Keeping your back locked in a neutral-spine position, lift using your leg muscles. Lift with your legs, keeping your back straight.  Test the weight of unknown objects before attempting to lift them.  Try to keep your elbows locked down at your sides in order get the best strength from your shoulders when carrying an object.  Always ask for help when lifting heavy or awkward objects. INCORRECT LIFTING TECHNIQUES DO NOT:   Lock your knees when lifting, even if it is a small object.  Bend and twist. Pivot at your feet or move your feet when needing to change directions.  Assume that you can safely pick up even a paperclip without proper posture. Document Released: 09/03/2005 Document Revised: 11/26/2011 Document Reviewed: 12/16/2008 [facility] Patient Information 2015 [place], [place].  This information is not intended to replace advice given to you by  your health care provider. Make sure you discuss any questions you have with your health care provider. ° °

## 2015-04-14 ENCOUNTER — Ambulatory Visit
Admission: EM | Admit: 2015-04-14 | Discharge: 2015-04-14 | Disposition: A | Discharge status: Home / Self Care | Payer: Worker's Compensation | Attending: Internal Medicine | Admitting: Internal Medicine

## 2015-04-14 DIAGNOSIS — S46912D Strain of unspecified muscle, fascia and tendon at shoulder and upper arm level, left arm, subsequent encounter: Secondary | ICD-10-CM

## 2015-04-14 NOTE — ED Notes (Signed)
Pt states "I am here to follow-up my left wrist injury and right leg injury, the pain is better in my wrist, but some pain in my right leg."

## 2015-04-14 NOTE — ED Provider Notes (Signed)
CSN: [id number]     Arrival date & time 04/14/15  1325 History   First MD Initiated Contact with Patient 04/14/15 1357     Chief Complaint  Patient presents with  . Follow-up   HPI   54yo lady, persistent mild discomfort with L shoulder motion, L wrist injury resolved per pt, R lower calf discomfort persists (not severe).    Past Medical History  Diagnosis Date  . Asthma   . Hypertension   . Heart murmur    Past Surgical History  Procedure Laterality Date  . Abdominal hysterectomy      History  Substance Use Topics  . Smoking status: Never Smoker   . Smokeless tobacco: Not on file  . Alcohol Use: No    Review of Systems  All other systems reviewed and are negative.   Allergies  Codeine; Penicillins; Percocet; Sulfa antibiotics; and Vicodin  Home Medications   Prior to Admission medications   Medication Sig Start Date End Date Taking? Authorizing Provider  acetaminophen (TYLENOL) 500 MG tablet Take 2 tablets (1,000 mg total) by mouth every 6 (six) hours as needed for moderate pain. 04/04/15  Yes Barbaraann Barthel, NP  lisinopril (PRINIVIL,ZESTRIL) 10 MG tablet Take 10 mg by mouth daily.   Yes Historical Provider, MD  albuterol (PROVENTIL HFA;VENTOLIN HFA) 108 (90 BASE) MCG/ACT inhaler Inhale into the lungs every 6 (six) hours as needed for wheezing or shortness of breath.    Historical Provider, MD  metaxalone (SKELAXIN) 800 MG tablet Take 1 tablet (800 mg total) by mouth 3 (three) times daily. 04/04/15   Jarold Song Betancourt, NP   BP 146/84 mmHg  Pulse 80  Temp(Src) 98.4 F (36.9 C) (Tympanic)  Resp 16  Ht  (1.651 m)  Wt 137 lb (62.143 kg)  BMI 22.80 kg/m2  SpO2 100% Physical Exam  Constitutional: She is oriented to person, place, and time. No distress.  Alert, nicely groomed  HENT:  Head: Atraumatic.  Eyes:  Conjugate gaze, no eye redness/drainage  Neck: Neck supple.  Cardiovascular: Normal rate.   Pulmonary/Chest: No respiratory distress.  Abdominal:  She exhibits no distension.  Musculoskeletal: Normal range of motion.  No leg swelling Gait steady, no staggering, no limping. ROM bilat shoulders full/symmetric, some discomfort reported with L shoulder ROM Strength in B UEs 5/5, symmetric, proximal and distal    Neurological: She is alert and oriented to person, place, and time.  Skin: Skin is warm and dry.  No cyanosis  Nursing note and vitals reviewed.   ED Course  Procedures none MDM   1. Left shoulder strain, subsequent encounter    Ok to return to full duty. Recheck as needed.    Eustace Moore, MD 04/24/15 [phone]

## 2015-07-12 ENCOUNTER — Ambulatory Visit
Admission: EM | Admit: 2015-07-12 | Discharge: 2015-07-12 | Disposition: A | Discharge status: Home / Self Care | Payer: 59 | Attending: Family Medicine | Admitting: Family Medicine

## 2015-07-12 ENCOUNTER — Encounter: Payer: Self-pay | Admitting: Emergency Medicine

## 2015-07-12 DIAGNOSIS — N390 Urinary tract infection, site not specified: Secondary | ICD-10-CM

## 2015-07-12 DIAGNOSIS — K529 Noninfective gastroenteritis and colitis, unspecified: Secondary | ICD-10-CM

## 2015-07-12 DIAGNOSIS — E739 Lactose intolerance, unspecified: Secondary | ICD-10-CM

## 2015-07-12 LAB — URINALYSIS COMPLETE WITH MICROSCOPIC (ARMC ONLY)
BILIRUBIN URINE: NEGATIVE
Glucose, UA: NEGATIVE mg/dL
HGB URINE DIPSTICK: NEGATIVE
Ketones, ur: NEGATIVE mg/dL
NITRITE: NEGATIVE
Protein, ur: NEGATIVE mg/dL
SPECIFIC GRAVITY, URINE: 1.02 (ref 1.005–1.030)
pH: 6 (ref 5.0–8.0)

## 2015-07-12 LAB — COMPREHENSIVE METABOLIC PANEL
ALT: 12 U/L — ABNORMAL LOW (ref 14–54)
ANION GAP: 9 (ref 5–15)
AST: 19 U/L (ref 15–41)
Albumin: 4.6 g/dL (ref 3.5–5.0)
Alkaline Phosphatase: 80 U/L (ref 38–126)
BUN: 9 mg/dL (ref 6–20)
CO2: 29 mmol/L (ref 22–32)
Calcium: 9.8 mg/dL (ref 8.9–10.3)
Chloride: 100 mmol/L — ABNORMAL LOW (ref 101–111)
Creatinine, Ser: 0.72 mg/dL (ref 0.44–1.00)
GFR calc non Af Amer: >60 mL/min (ref >60)
Glucose, Bld: 95 mg/dL (ref 65–99)
Potassium: 3.7 mmol/L (ref 3.5–5.1)
SODIUM: 138 mmol/L (ref 135–145)
TOTAL PROTEIN: 7.8 g/dL (ref 6.5–8.1)
Total Bilirubin: 0.8 mg/dL (ref 0.3–1.2)

## 2015-07-12 MED ORDER — CIPROFLOXACIN HCL 250 MG PO TABS: 250.0000 mg | ORAL_TABLET | Freq: Two times a day (BID) | ORAL | Status: DC

## 2015-07-12 NOTE — Discharge Instructions (Signed)
Diet for Lactose Intolerance, Adult  Lactose intolerance is when the body is not able to digest lactose, a natural sugar found in milk and milk products. If you are lactose intolerant, you should avoid consuming food and drinks with lactose.   WHAT DO I NEED TO KNOW ABOUT THIS DIET?  · Avoid consuming foods and beverages with lactose.  · Look for the words "lactose-free" or "lactose-reduced" on food labels. You can have lactose-free foods and may be able to have small amounts of lactose-reduced foods.  · Make sure you get enough nutrients in your diet. People on this diet sometimes have trouble getting enough calcium, riboflavin, and vitamin D. Take supplements if directed by your health care provider. Talk to your health care provider about supplements if you are not taking any.  WHICH FOODS HAVE LACTOSE?  Lactose is found in milk and milk products, such as:   · Yogurt.    · Cheese.  · Butter.    · Margarine.  · Sour cream.    · Creamer.    · Whipped toppings and nondairy creamers.  · Ice cream and other milk-based desserts.  Lactose is also found in foods made with milk or milk ingredients. To find out whether a food is made with milk or a milk ingredient, look at the ingredients list. Avoid foods with the statement "May contain milk" and foods that contain:   · Butter.    · Cream.   · Milk.  · Milk solids.  · Milk powder.     · Whey.  · Curd.   · Caseinate.  · Lactose.  WHAT ARE SOME ALTERNATIVES TO MILK AND FOODS MADE WITH MILK PRODUCTS?  · Lactose-free products, such as lactose-free milk.  · Almond or rice milk.  · Soy products, such as soy yogurt, soy cheese, soy ice cream, soy-based sour cream, and soy-based infant formula.  · Nondairy products, such as nondairy creamers and nondairy whipped topping. Note that nondairy products sometimes contain lactose, so it is important to check the ingredients list.  CAN I HAVE ANY FOODS WITH LACTOSE?  Some people with lactose intolerance can safely eat foods that have a  little lactose. Foods with a little lactose have less than 1 g of lactose per serving. Examples of foods with a little lactose are:   · Aged cheese (such as Swiss, cheddar, or Parmesan cheese). One serving is about 1-2 oz.  · Cream cheese. One serving is about 2 Tbsp.  · Ricotta cheese. One serving is about ½ cup.  If you decide to try a food that has lactose:   · Eat only one food with lactose in it at a time.  · Eat only a small amount of the food.  · Stop eating the food if your symptoms return.  Some dairy products that are more likely than others to be tolerated include:  · Cheese, especially if it is aged.  · Cultured dairy products, such as yogurt, buttermilk, cottage cheese, and kefir. The healthy bacteria in these products help digest lactose.  · Lactose-hydrolyzed milk. This product contains 40-90% less lactose than milk.  AM I GETTING ENOUGH CALCIUM?  Calcium is found in many foods with lactose and is important for bone health. The amount of calcium you need depends on your age:   · Adults younger than 50 years need 1000 mg of calcium a day.  · Adults older than 50 years need 1200 mg of calcium a day.  Make sure you get enough calcium by taking a calcium supplement   or by eating lactose-free foods that are high in calcium, such as:   · Almonds, ¼ cup (95 mg).  · Broccoli, cooked, 1 cup (60 mg).  · Calcium-fortified breakfast cereals, 1 cup (100-1000 mg).  · Calcium-fortified rice or almond milk, 1 cup (300 mg).  · Calcium-fortified soy milk, 1 cup (300-400 mg).  · Canned salmon with edible bones, 3 oz (180 mg).  · Collard greens, cooked, ½ cup (125 mg).  · Edamame, cooked, ½ cup (125 mg).  · Kale, frozen or cooked, ½ cup (90 mg).  · Orange juice with calcium added, 1 cup (300-350 mg).  · Sardines with edible bones, 3 oz (325 mg).  · Spinach, cooked, ½ cup (145 mg).  · Tofu set with calcium sulfate, ½ cup (250 mg).     This information is not intended to replace advice given to you by your health care  provider. Make sure you discuss any questions you have with your health care provider.     Document Released: 03/31/2014 Document Reviewed: 03/31/2014  [employer] Interactive Patient Education ©2016 [employer]

## 2015-07-12 NOTE — ED Provider Notes (Signed)
CSN: [id number]     Arrival date & time 07/12/15  1109 History   First MD Initiated Contact with Patient 07/12/15 1225     Chief Complaint  Patient presents with  . Back Pain   (Consider location/radiation/quality/duration/timing/severity/associated sxs/prior Treatment) HPI Comments: 54 yo female with a c/o right sided back pain and urinary frequency since yesterday. Denies any fevers, chills, vomiting. Also states for the past week has some abdominal cramping and mild diarrhea. States these symptoms sometimes occur when she ingests dairy products.   The history is provided by the patient.    Past Medical History  Diagnosis Date  . Asthma   . Hypertension   . Heart murmur    Past Surgical History  Procedure Laterality Date  . Abdominal hysterectomy     History reviewed. No pertinent family history. Social History  Substance Use Topics  . Smoking status: Never Smoker   . Smokeless tobacco: None  . Alcohol Use: No   OB History    No data available     Review of Systems  Allergies  Codeine; Penicillins; Percocet; Sulfa antibiotics; and Vicodin  Home Medications   Prior to Admission medications   Medication Sig Start Date End Date Taking? Authorizing Provider  acetaminophen (TYLENOL) 500 MG tablet Take 2 tablets (1,000 mg total) by mouth every 6 (six) hours as needed for moderate pain. 04/04/15   Barbaraann Barthel, NP  albuterol (PROVENTIL HFA;VENTOLIN HFA) 108 (90 BASE) MCG/ACT inhaler Inhale into the lungs every 6 (six) hours as needed for wheezing or shortness of breath.    Historical Provider, MD  ciprofloxacin (CIPRO) 250 MG tablet Take 1 tablet (250 mg total) by mouth every 12 (twelve) hours. 07/12/15   Payton Mccallum, MD  lisinopril (PRINIVIL,ZESTRIL) 10 MG tablet Take 10 mg by mouth daily.    Historical Provider, MD  metaxalone (SKELAXIN) 800 MG tablet Take 1 tablet (800 mg total) by mouth 3 (three) times daily. 04/04/15   Barbaraann Barthel, NP   Meds Ordered and  Administered this Visit  Medications - No data to display  BP 178/96 mmHg  Pulse 67  Temp(Src) 97 F (36.1 C) (Tympanic)  Resp 16  Ht  (1.626 m)  SpO2 100% No data found.   Physical Exam  Constitutional: She appears well-developed and well-nourished. No distress.  Cardiovascular: Normal rate, regular rhythm and normal heart sounds.   Pulmonary/Chest: Effort normal and breath sounds normal. No respiratory distress.  Abdominal: Soft. Bowel sounds are normal. She exhibits no distension and no mass. There is tenderness (mild right upper quadrant tenderness to palpation; no rebound or guarding). There is no rebound and no guarding.  Skin: She is not diaphoretic.  Nursing note and vitals reviewed.   ED Course  Procedures (including critical care time)  Labs Review Labs Reviewed  URINALYSIS COMPLETEWITH MICROSCOPIC ([facility] ONLY) - Abnormal; Notable for the following:    Leukocytes, UA 1+ (*)    Squamous Epithelial / LPF 0-5 (*)    All other components within normal limits  COMPREHENSIVE METABOLIC PANEL - Abnormal; Notable for the following:    Chloride 100 (*)    ALT 12 (*)    All other components within normal limits    Imaging Review No results found.   Visual Acuity Review  Right Eye Distance:   Left Eye Distance:   Bilateral Distance:    Right Eye Near:   Left Eye Near:    Bilateral Near:  MDM   1. UTI (lower urinary tract infection)   2. Gastroenteritis   3. Lactose intolerance    Discharge Medication List as of 07/12/2015  1:42 PM    START taking these medications   Details  ciprofloxacin (CIPRO) 250 MG tablet Take 1 tablet (250 mg total) by mouth every 12 (twelve) hours., Starting 07/12/2015, Until Discontinued, Normal       1. Lab results and diagnosis reviewed with patient 2. rx as per orders above; reviewed possible side effects, interactions, risks and benefits  3. Recommend supportive treatment with increased water intake, dietary  modifications (written information given to patient) 4. Follow prn if symptoms worsen or don't improve    Payton Mccallumrlando Cayman Brogden, MD 07/12/15 [phone]

## 2015-07-12 NOTE — ED Notes (Signed)
Patient c/o back pain and increase in urinary frequency since yesterday.

## 2015-11-04 ENCOUNTER — Encounter: Payer: Self-pay | Admitting: *Deleted

## 2015-11-04 ENCOUNTER — Encounter: Payer: Self-pay | Admitting: Emergency Medicine

## 2015-11-04 ENCOUNTER — Emergency Department: Payer: Managed Care, Other (non HMO)

## 2015-11-04 ENCOUNTER — Ambulatory Visit (INDEPENDENT_AMBULATORY_CARE_PROVIDER_SITE_OTHER)
Admission: EM | Admit: 2015-11-04 | Discharge: 2015-11-04 | Disposition: A | Discharge status: Other Acute Inpatient Hospital | Payer: Managed Care, Other (non HMO) | Source: Home / Self Care | Attending: Family Medicine | Admitting: Family Medicine

## 2015-11-04 ENCOUNTER — Observation Stay
Admission: EM | Admit: 2015-11-04 | Discharge: 2015-11-05 | Disposition: A | Discharge status: Home / Self Care | Payer: Managed Care, Other (non HMO) | Attending: Internal Medicine | Admitting: Internal Medicine

## 2015-11-04 DIAGNOSIS — Z880 Allergy status to penicillin: Secondary | ICD-10-CM | POA: Diagnosis not present

## 2015-11-04 DIAGNOSIS — R9431 Abnormal electrocardiogram [ECG] [EKG]: Secondary | ICD-10-CM | POA: Diagnosis not present

## 2015-11-04 DIAGNOSIS — R0789 Other chest pain: Principal | ICD-10-CM | POA: Insufficient documentation

## 2015-11-04 DIAGNOSIS — Z882 Allergy status to sulfonamides status: Secondary | ICD-10-CM | POA: Diagnosis not present

## 2015-11-04 DIAGNOSIS — Z79899 Other long term (current) drug therapy: Secondary | ICD-10-CM | POA: Diagnosis not present

## 2015-11-04 DIAGNOSIS — Z7951 Long term (current) use of inhaled steroids: Secondary | ICD-10-CM | POA: Insufficient documentation

## 2015-11-04 DIAGNOSIS — J45909 Unspecified asthma, uncomplicated: Secondary | ICD-10-CM | POA: Diagnosis not present

## 2015-11-04 DIAGNOSIS — R011 Cardiac murmur, unspecified: Secondary | ICD-10-CM | POA: Insufficient documentation

## 2015-11-04 DIAGNOSIS — Z7982 Long term (current) use of aspirin: Secondary | ICD-10-CM | POA: Diagnosis not present

## 2015-11-04 DIAGNOSIS — Z841 Family history of disorders of kidney and ureter: Secondary | ICD-10-CM | POA: Diagnosis not present

## 2015-11-04 DIAGNOSIS — R079 Chest pain, unspecified: Secondary | ICD-10-CM

## 2015-11-04 DIAGNOSIS — H9209 Otalgia, unspecified ear: Secondary | ICD-10-CM | POA: Diagnosis not present

## 2015-11-04 DIAGNOSIS — Z90710 Acquired absence of both cervix and uterus: Secondary | ICD-10-CM | POA: Insufficient documentation

## 2015-11-04 DIAGNOSIS — I10 Essential (primary) hypertension: Secondary | ICD-10-CM | POA: Diagnosis present

## 2015-11-04 DIAGNOSIS — Z885 Allergy status to narcotic agent status: Secondary | ICD-10-CM | POA: Insufficient documentation

## 2015-11-04 LAB — CBC
HCT: 41.7 % (ref 35.0–47.0)
Hemoglobin: 13.7 g/dL (ref 12.0–16.0)
MCH: 29.3 pg (ref 26.0–34.0)
MCHC: 32.9 g/dL (ref 32.0–36.0)
MCV: 89.1 fL (ref 80.0–100.0)
Platelets: 258 10*3/uL (ref 150–440)
RBC: 4.68 MIL/uL (ref 3.80–5.20)
RDW: 12.9 % (ref 11.5–14.5)
WBC: 8.9 10*3/uL (ref 3.6–11.0)

## 2015-11-04 LAB — COMPREHENSIVE METABOLIC PANEL
ALT: 13 U/L — ABNORMAL LOW (ref 14–54)
ANION GAP: 8 (ref 5–15)
AST: 20 U/L (ref 15–41)
Albumin: 4.7 g/dL (ref 3.5–5.0)
Alkaline Phosphatase: 119 U/L (ref 38–126)
BUN: 12 mg/dL (ref 6–20)
CHLORIDE: 104 mmol/L (ref 101–111)
CO2: 28 mmol/L (ref 22–32)
Calcium: 10 mg/dL (ref 8.9–10.3)
Creatinine, Ser: 0.84 mg/dL (ref 0.44–1.00)
GFR calc non Af Amer: >60 mL/min (ref >60)
Glucose, Bld: 98 mg/dL (ref 65–99)
Potassium: 3.9 mmol/L (ref 3.5–5.1)
Sodium: 140 mmol/L (ref 135–145)
Total Bilirubin: 0.7 mg/dL (ref 0.3–1.2)
Total Protein: 8.3 g/dL — ABNORMAL HIGH (ref 6.5–8.1)

## 2015-11-04 LAB — TROPONIN I

## 2015-11-04 MED ORDER — NITROGLYCERIN 0.4 MG SL SUBL
0.4000 mg | SUBLINGUAL_TABLET | SUBLINGUAL | Status: DC | PRN
  Administered 2015-11-04: 0.4 mg via SUBLINGUAL

## 2015-11-04 MED ORDER — ACETAMINOPHEN 325 MG PO TABS
650.0000 mg | ORAL_TABLET | Freq: Once | ORAL | Status: AC
  Administered 2015-11-04: 650 mg via ORAL
  Filled 2015-11-04: qty 2

## 2015-11-04 MED ORDER — ASPIRIN 81 MG PO CHEW
324.0000 mg | CHEWABLE_TABLET | Freq: Once | ORAL | Status: AC
  Administered 2015-11-04: 324 mg via ORAL

## 2015-11-04 MED ORDER — KETOROLAC TROMETHAMINE 30 MG/ML IJ SOLN
30.0000 mg | Freq: Once | INTRAMUSCULAR | Status: AC
  Administered 2015-11-04: 30 mg via INTRAVENOUS
  Filled 2015-11-04: qty 1

## 2015-11-04 NOTE — Discharge Instructions (Signed)
Chest Wall Pain °Chest wall pain is pain in or around the bones and muscles of your chest. Sometimes, an injury causes this pain. Sometimes, the cause may not be known. This pain may take several weeks or longer to get better. °HOME CARE °Pay attention to any changes in your symptoms. Take these actions to help with your pain: °· Rest as told by your doctor. °· Avoid activities that cause pain. Try not to use your chest, belly (abdominal), or side muscles to lift heavy things. °· If directed, apply ice to the painful area: °¨ Put ice in a plastic bag. °¨ Place a towel between your skin and the bag. °¨ Leave the ice on for 20 minutes, 2-3 times per day. °· Take over-the-counter and prescription medicines only as told by your doctor. °· Do not use tobacco products, including cigarettes, chewing tobacco, and e-cigarettes. If you need help quitting, ask your doctor. °· Keep all follow-up visits as told by your doctor. This is important. °GET HELP IF: °· You have a fever. °· Your chest pain gets worse. °· You have new symptoms. °GET HELP RIGHT AWAY IF: °· You feel sick to your stomach (nauseous) or you throw up (vomit). °· You feel sweaty or light-headed. °· You have a cough with phlegm (sputum) or you cough up blood. °· You are short of breath. °  °This information is not intended to replace advice given to you by your health care provider. Make sure you discuss any questions you have with your health care provider. °  °Document Released: 02/20/2008 Document Revised: 05/25/2015 Document Reviewed: 11/29/2014 °Elsevier Interactive Patient Education ©2016 [employer] ° °

## 2015-11-04 NOTE — ED Provider Notes (Signed)
CSN: [id number]     Arrival date & time 11/04/15  1733 History   First MD Initiated Contact with Patient 11/04/15 1754    Nurses notes were reviewed. Chief Complaint  Patient presents with  . Chest Pain   Sincerely because of chest pain. 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 She finished her shift went home and he came out to be seen and evaluated because of the substernal chest pain that she's had. No history of heart disease before the past but she has history of hypertension and she does not know her cholesterol is. She states his pain does not radiate plus crushing pain in this sternal area. She still had pain like this before. She she does not smoke. Irving Burton history is positive for father on dialysis latest succumbed to heart disease. She states the pain hurts when she takes a deep breath degrees he moves doesn't move.   (Consider location/radiation/quality/duration/timing/severity/associated sxs/prior Treatment) Patient is a 55 y.o. female presenting with chest pain. The history is provided by the patient and a relative. No language interpreter was used.  Chest Pain Pain location:  Substernal area and L chest Pain quality: aching and crushing   Pain radiates to:  Does not radiate Pain radiates to the back: no   Pain severity:  Severe Onset quality:  Sudden Duration:  3 hours Timing:  Constant Chronicity:  New Context: movement   Relieved by:  Nothing Worsened by:  Nothing tried Associated symptoms: cough   Risk factors: hypertension     Past Medical History  Diagnosis Date  . Asthma   . Hypertension   . Heart murmur    Past Surgical History  Procedure Laterality Date  . Abdominal hysterectomy    . Foot surgery Left   . Hand surgery Right    History reviewed. No pertinent family history. Social History  Substance Use Topics  . Smoking status: Never Smoker   . Smokeless tobacco: None  . Alcohol Use: No   OB  History    No data available     Review of Systems  Respiratory: Positive for cough.   Cardiovascular: Positive for chest pain.  All other systems reviewed and are negative.   Allergies  Codeine; Penicillins; Percocet; Sulfa antibiotics; and Vicodin  Home Medications   Prior to Admission medications   Medication Sig Start Date End Date Taking? Authorizing Provider  acetaminophen (TYLENOL) 500 MG tablet Take 2 tablets (1,000 mg total) by mouth every 6 (six) hours as needed for moderate pain. 04/04/15   Barbaraann Barthel, NP  albuterol (PROVENTIL HFA;VENTOLIN HFA) 108 (90 BASE) MCG/ACT inhaler Inhale into the lungs every 6 (six) hours as needed for wheezing or shortness of breath.    Historical Provider, MD  ciprofloxacin (CIPRO) 250 MG tablet Take 1 tablet (250 mg total) by mouth every 12 (twelve) hours. 07/12/15   Payton Mccallum, MD  fluticasone (FLONASE) 50 MCG/ACT nasal spray Place 1 spray into both nostrils daily.    Historical Provider, MD  lisinopril (PRINIVIL,ZESTRIL) 10 MG tablet Take 10 mg by mouth daily.    Historical Provider, MD  metaxalone (SKELAXIN) 800 MG tablet Take 1 tablet (800 mg total) by mouth 3 (three) times daily. 04/04/15   Barbaraann Barthel, NP   Meds Ordered and Administered this Visit   Medications  nitroGLYCERIN (NITROSTAT) SL tablet 0.4 mg (0.4 mg Sublingual Given 11/04/15 1814)  aspirin chewable tablet 324 mg (324 mg Oral Given  11/04/15 1800)    BP 175/81 mmHg  Pulse 63  Temp(Src) 97.4 F (36.3 C) (Oral)  Resp 18  Ht  (1.651 m)  Wt 136 lb (61.689 kg)  BMI 22.63 kg/m2  SpO2 100% No data found.   Physical Exam  Constitutional: She is oriented to person, place, and time. She appears well-developed and well-nourished.  HENT:  Head: Normocephalic and atraumatic.  Eyes: Conjunctivae are normal. Pupils are equal, round, and reactive to light.  Neck: Normal range of motion. Neck supple. No tracheal deviation present. No thyromegaly present.    Cardiovascular: Normal rate, regular rhythm and normal heart sounds.   Pulmonary/Chest: Effort normal and breath sounds normal. She exhibits tenderness.    Abdominal: Soft.  Musculoskeletal: Normal range of motion. She exhibits no edema or tenderness.  Neurological: She is alert and oriented to person, place, and time.  Skin: Skin is warm and dry.  Psychiatric: She has a normal mood and affect.  Vitals reviewed.   ED Course  Procedures (including critical care time)  Labs Review Labs Reviewed - No data to display  Imaging Review No results found.   Visual Acuity Review  Right Eye Distance:   Left Eye Distance:   Bilateral Distance:    Right Eye Near:   Left Eye Near:    Bilateral Near:         MDM   1. Chest pain, central   2. Chest wall pain   3. Abnormal finding on EKG    Patient with chest pain. Because of the abnormal EKG intensity of the chest pain age and other risk factors as hypertension family medical history will transfer to [facility] ED via EMS. Discussed case with charge nurse Dorinda Hill at [facility] and saline lock be placed as well. Patient was given 4 baby aspirins and a nitroglycerin tablet as well. Refill improvement of the discomfort.  ED ECG REPORT I, Rut Betterton H, the attending physician, personally viewed and interpreted this ECG.   Date: 11/04/2015  EKG Time: 17:48:52  Rate: 61  Rhythm: there are no previous tracings available for comparison, norma questionall sinus rhythm  Axis: 65  Intervals:nonspecific intraventricular conduction delay and Possible right ventricular conduction delay  ST&T Change: inverted T wave  In V1, V4, V5  Hassan Rowan, MD 11/04/15 1830

## 2015-11-04 NOTE — ED Notes (Signed)
Per EMS patient was give 1 SL Nitro tablet at [facility] urgent care. EMS gave 1 spray of Nitro due to patient still having pain and having elevated BP.

## 2015-11-04 NOTE — ED Notes (Signed)
MD at bedside. 

## 2015-11-04 NOTE — ED Notes (Signed)
Patient brought in by EMS from [facility] Urgent care for evaluation of chest pain that started today about 2pm

## 2015-11-04 NOTE — ED Notes (Signed)
Pt questioned about allergies. Pt reports she takes tylenol at home with no issues.

## 2015-11-04 NOTE — ED Provider Notes (Signed)
[facility] Emergency Department Provider Note  ____________________________________________    I have reviewed the triage vital signs and the nursing notes.   HISTORY  Chief Complaint Chest Pain    HPI Cindy Fisher is a 55 y.o. female who presents with chest pain. Patient reports that her chest pain started this afternoon while she was on the bus. She reports it is severe and substernal. It is aching and pressure-like in nature. She has never had this pain before. She does not smoke.     Past Medical History  Diagnosis Date  . Asthma   . Hypertension   . Heart murmur     There are no active problems to display for this patient.   Past Surgical History  Procedure Laterality Date  . Abdominal hysterectomy    . Foot surgery Left   . Hand surgery Right     Current Outpatient Rx  Name  Route  Sig  Dispense  Refill  . acetaminophen (TYLENOL) 500 MG tablet   Oral   Take 1,000 mg by mouth every 6 (six) hours as needed for mild pain or headache.         . albuterol (PROVENTIL HFA;VENTOLIN HFA) 108 (90 BASE) MCG/ACT inhaler   Inhalation   Inhale 2 puffs into the lungs every 6 (six) hours as needed for wheezing or shortness of breath.          Marland Kitchen amLODipine (NORVASC) 5 MG tablet   Oral   Take 5 mg by mouth daily.         . fluticasone (FLONASE) 50 MCG/ACT nasal spray   Each Nare   Place 1 spray into both nostrils daily as needed for rhinitis.          . ciprofloxacin (CIPRO) 250 MG tablet   Oral   Take 1 tablet (250 mg total) by mouth every 12 (twelve) hours. Patient not taking: Reported on 11/04/2015   6 tablet   0     Allergies Codeine; Penicillins; Percocet; Sulfa antibiotics; and Vicodin  No family history on file.  Social History Social History  Substance Use Topics  . Smoking status: Never Smoker   . Smokeless tobacco: None  . Alcohol Use: No    Review of Systems  Constitutional: Negative for fever. Eyes:  Negative for visual changes. ENT: Negative for neck pain Cardiovascular: As above Respiratory: Negative for shortness of breath. Gastrointestinal: Negative for abdominal pain Genitourinary: Negative for dysuria. Musculoskeletal: Negative for back pain. Negative for neck pain. No calf pain Skin: Negative for rash. Neurological: Negative for headaches  Psychiatric: No anxiety    ____________________________________________   PHYSICAL EXAM:  VITAL SIGNS: ED Triage Vitals  Enc Vitals Group     BP 11/04/15 1911 150/93 mmHg     Pulse Rate 11/04/15 1911 70     Resp 11/04/15 1911 14     Temp 11/04/15 1911 97.9 F (36.6 C)     Temp Source 11/04/15 1911 Oral     SpO2 11/04/15 1911 100 %     Weight 11/04/15 1911 136 lb (61.689 kg)     Height 11/04/15 1911  (1.651 m)     Head Cir --      Peak Flow --      Pain Score 11/04/15 1908 10     Pain Loc --      Pain Edu? --      Excl. in GC? --      Constitutional: Alert and  oriented. Well appearing and in no distress. Eyes: Conjunctivae are normal.  ENT   Head: Normocephalic and atraumatic.   Mouth/Throat: Mucous membranes are moist. Cardiovascular: Normal rate, regular rhythm. Normal and symmetric distal pulses are present in all extremities. Patient does have mild tenderness to palpation over the inferior lateral sternum. Respiratory: Normal respiratory effort without tachypnea nor retractions. Breath sounds are clear and equal bilaterally.  Gastrointestinal: Soft and non-tender in all quadrants. No distention. There is no CVA tenderness. Genitourinary: deferred Musculoskeletal: Nontender with normal range of motion in all extremities. No lower extremity tenderness nor edema. Neurologic:  Normal speech and language. No gross focal neurologic deficits are appreciated. Skin:  Skin is warm, dry and intact. No rash noted. Psychiatric: Mood and affect are normal. Patient exhibits appropriate insight and  judgment.  ____________________________________________    LABS (pertinent positives/negatives)  Labs Reviewed  COMPREHENSIVE METABOLIC PANEL - Abnormal; Notable for the following:    Total Protein 8.3 (*)    ALT 13 (*)    All other components within normal limits  CBC  TROPONIN I    ____________________________________________   EKG  ED ECG REPORT I, Jene Every, the attending physician, personally viewed and interpreted this ECG.  Date: 11/04/2015 EKG Time: 7:12 PM Rate: 63 Rhythm: normal sinus rhythm QRS Axis: normal Intervals: normal ST/T Wave abnormalities: normal Conduction Disturbances: none Narrative Interpretation: unremarkable   ____________________________________________    RADIOLOGY I have personally reviewed any xrays that were ordered on this patient: Chest x-ray unremarkable  ____________________________________________   PROCEDURES  Procedure(s) performed: none  Critical Care performed:none  ____________________________________________   INITIAL IMPRESSION / ASSESSMENT AND PLAN / ED COURSE  Pertinent labs & imaging results that were available during my care of the patient were reviewed by me and considered in my medical decision making (see chart for details).  Patient presents with substernal chest pain that started shortly before arrival. Her pain has improved somewhat but she continues to have mild chest discomfort, I will admit her to the hospital for observation. Patient received aspirin at urgent care  ____________________________________________   FINAL CLINICAL IMPRESSION(S) / ED DIAGNOSES  Final diagnoses:  Chest pain, unspecified chest pain type     Jene Every, MD 11/04/15 2241

## 2015-11-05 ENCOUNTER — Observation Stay
Admit: 2015-11-05 | Discharge: 2015-11-05 | Disposition: A | Discharge status: Home / Self Care | Payer: Managed Care, Other (non HMO) | Attending: Internal Medicine | Admitting: Internal Medicine

## 2015-11-05 DIAGNOSIS — J45909 Unspecified asthma, uncomplicated: Secondary | ICD-10-CM | POA: Diagnosis present

## 2015-11-05 DIAGNOSIS — I10 Essential (primary) hypertension: Secondary | ICD-10-CM | POA: Diagnosis present

## 2015-11-05 DIAGNOSIS — R079 Chest pain, unspecified: Secondary | ICD-10-CM | POA: Diagnosis present

## 2015-11-05 LAB — APTT: aPTT: 30 seconds (ref 24–36)

## 2015-11-05 LAB — CREATININE, SERUM
CREATININE: 0.67 mg/dL (ref 0.44–1.00)
GFR calc non Af Amer: >60 mL/min (ref >60)

## 2015-11-05 LAB — TROPONIN I
Troponin I: <0.03 ng/mL (ref <0.031)
Troponin I: <0.03 ng/mL (ref <0.031)

## 2015-11-05 LAB — CBC
HCT: 37.1 % (ref 35.0–47.0)
Hemoglobin: 12.9 g/dL (ref 12.0–16.0)
MCH: 30.7 pg (ref 26.0–34.0)
MCHC: 34.8 g/dL (ref 32.0–36.0)
MCV: 88.1 fL (ref 80.0–100.0)
PLATELETS: 224 10*3/uL (ref 150–440)
RBC: 4.21 MIL/uL (ref 3.80–5.20)
RDW: 13 % (ref 11.5–14.5)
WBC: 6.7 10*3/uL (ref 3.6–11.0)

## 2015-11-05 LAB — PROTIME-INR
INR: 1.03
PROTHROMBIN TIME: 13.7 s (ref 11.4–15.0)

## 2015-11-05 LAB — MAGNESIUM: Magnesium: 1.9 mg/dL (ref 1.7–2.4)

## 2015-11-05 IMAGING — CT CT HEAD WITHOUT CONTRAST
1 series · 16 of 30 positions shown, 20 images · non-contrast
Comparison: None.

CLINICAL DATA: Headache, dizziness

EXAM:
CT HEAD WITHOUT CONTRAST
TECHNIQUE: Contiguous axial images were obtained from the base of the skull
through the vertex without intravenous contrast.

[Series 2: head wo · axial · 0.39mm/px · z∈[+12,+141]mm · 16 of 30 slices shown, 20 images]
[im 2/30  brain]
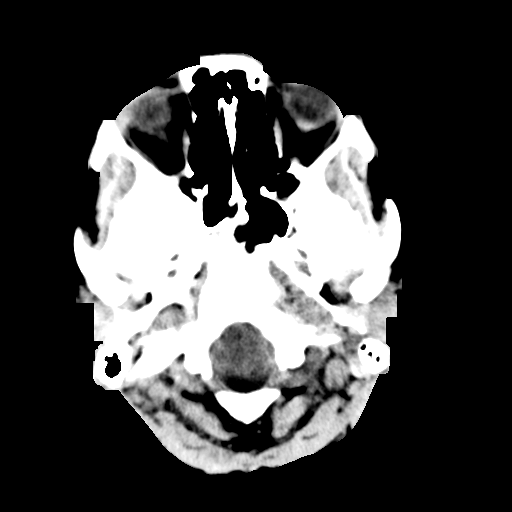
[im 2/30  bone]
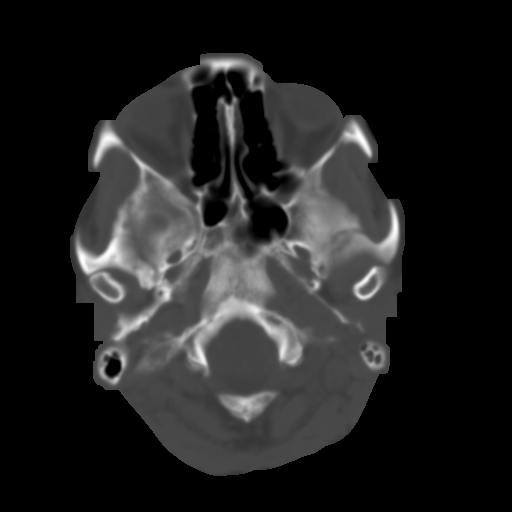
[im 4/30  brain]
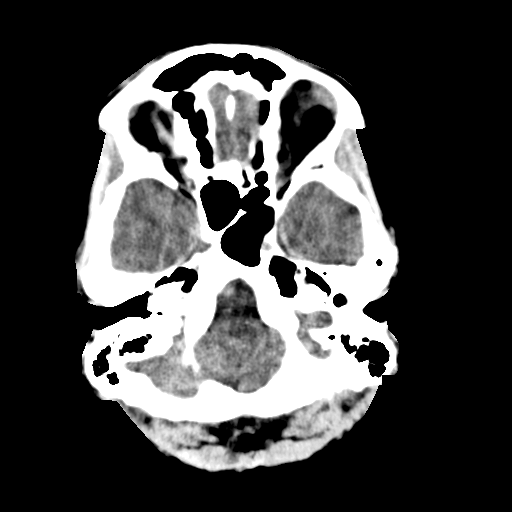
[im 6/30  brain]
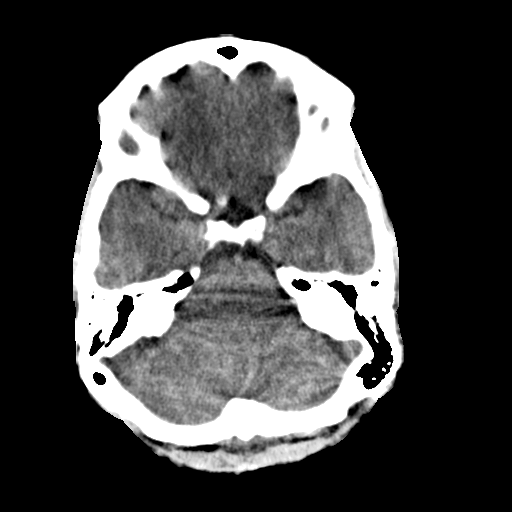
[im 8/30  brain]
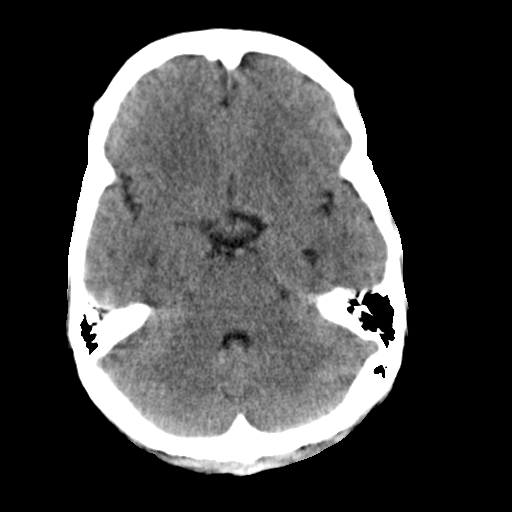
[im 9/30  brain]
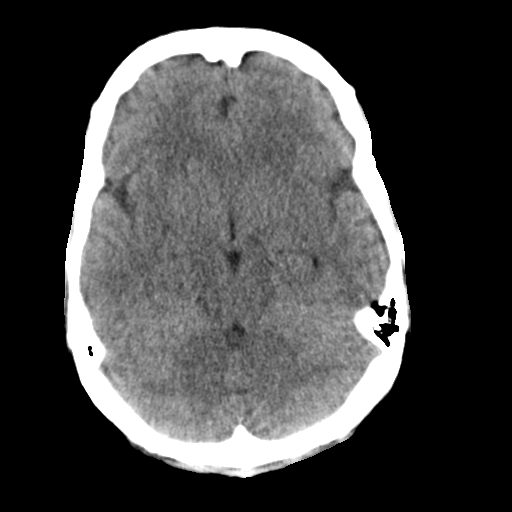
[im 9/30  bone]
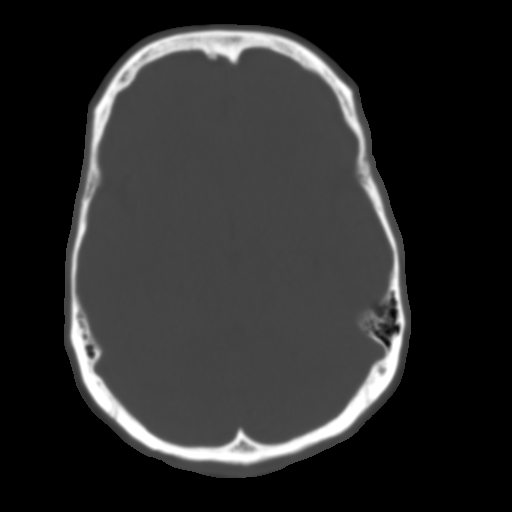
[im 11/30  brain]
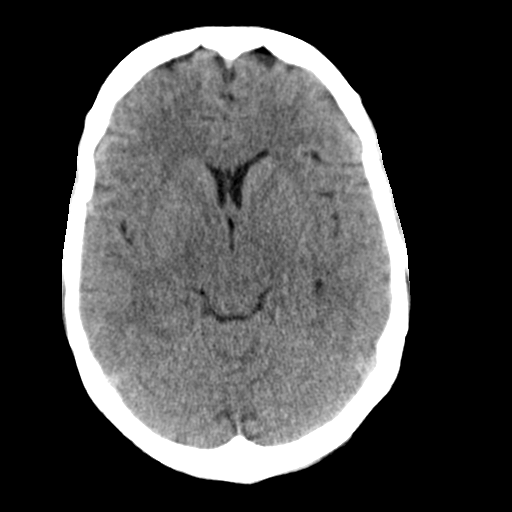
[im 13/30  brain]
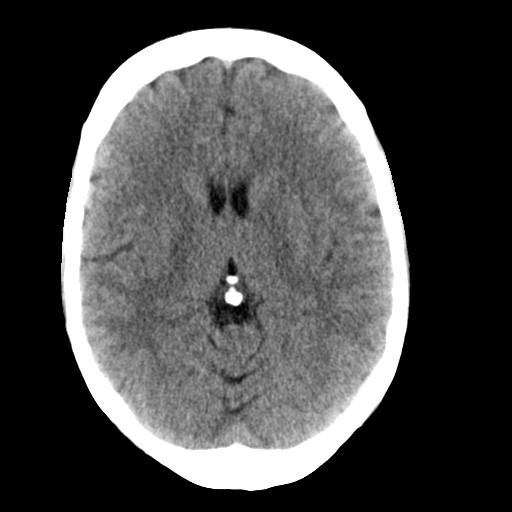
[im 15/30  brain]
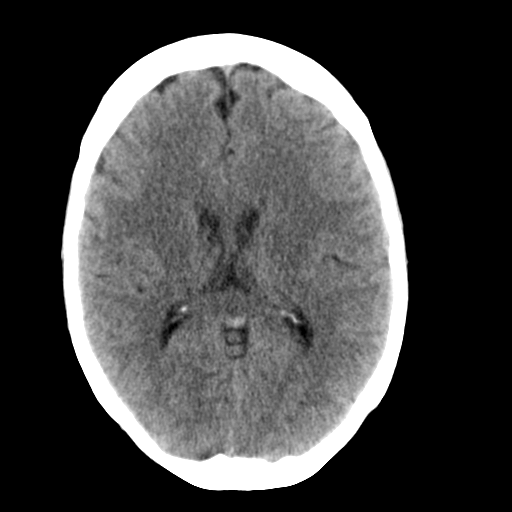
[im 16/30  brain]
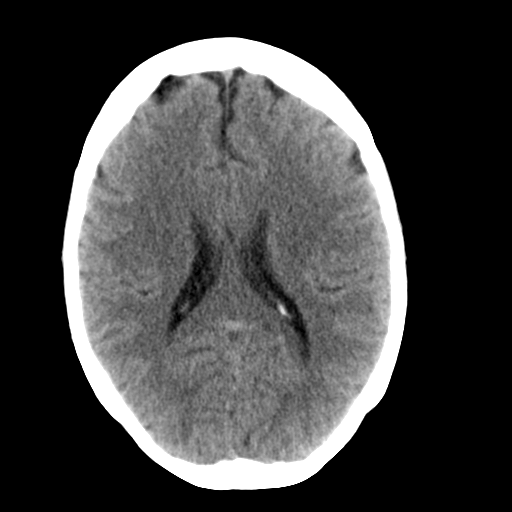
[im 16/30  bone]
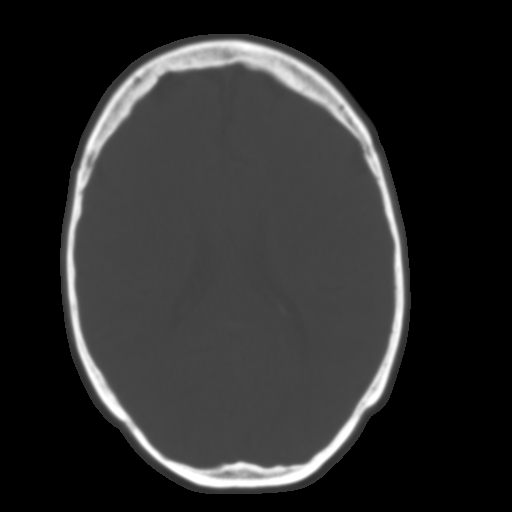
[im 18/30  brain]
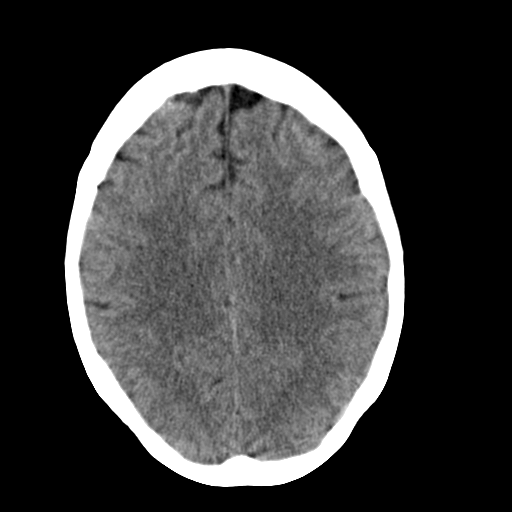
[im 20/30  brain]
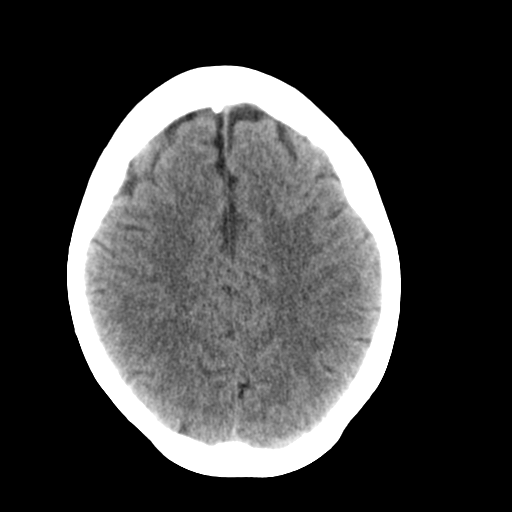
[im 22/30  brain]
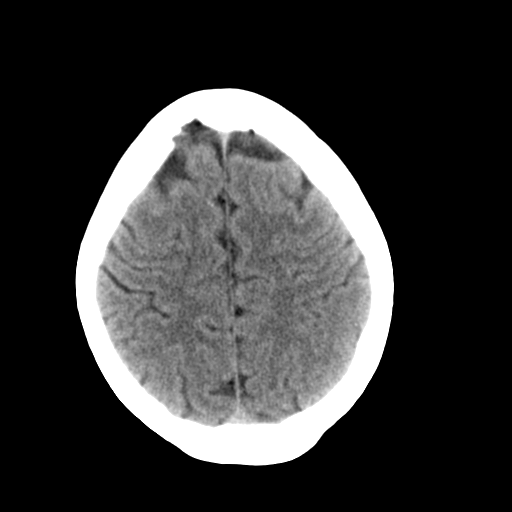
[im 23/30  brain]
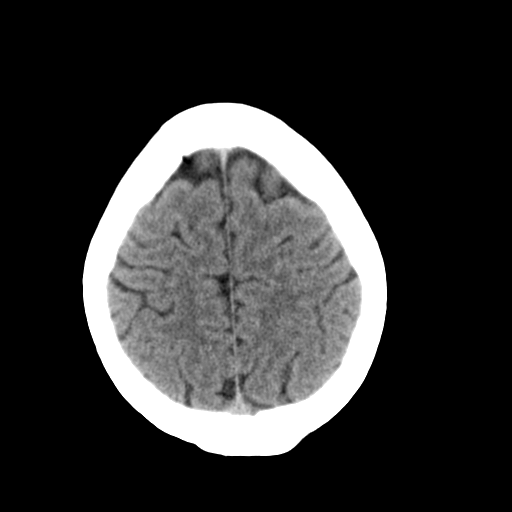
[im 23/30  bone]
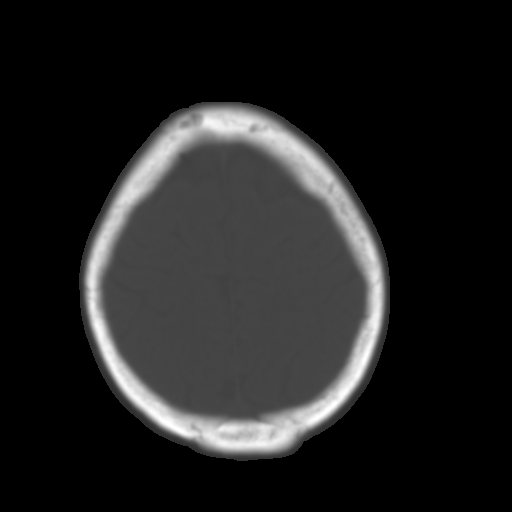
[im 25/30  brain]
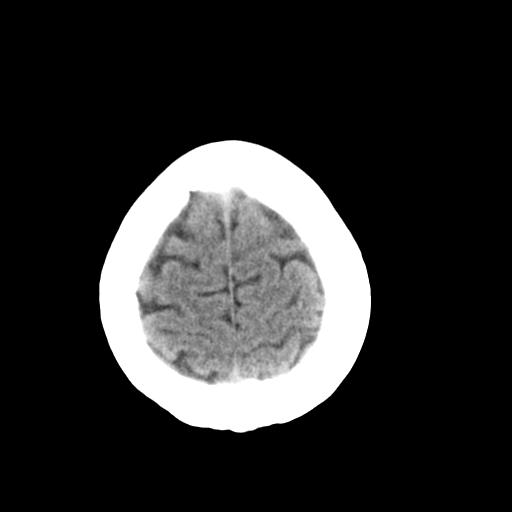
[im 27/30  brain]
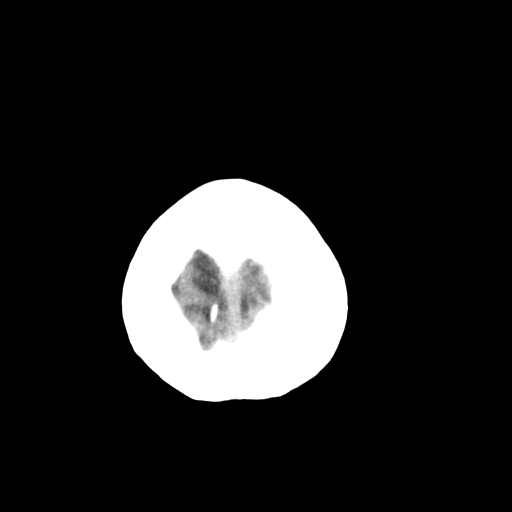
[im 29/30  brain]
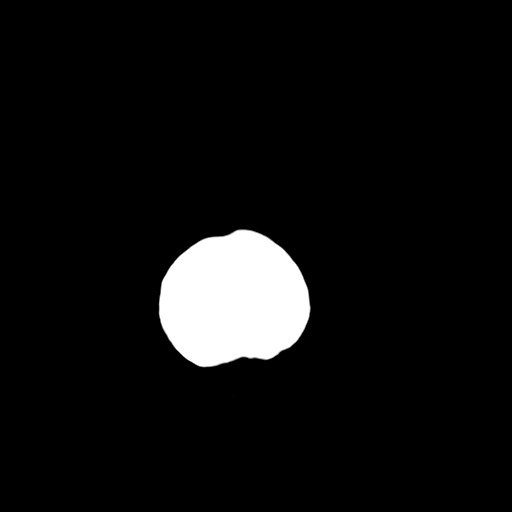

[16 of 30 positions shown; findings below may reference images not displayed]

FINDINGS: No skull fracture is noted. Paranasal sinuses and mastoid air cells
are unremarkable. No intracranial hemorrhage, mass effect or midline
shift. No acute infarction. No mass lesion is noted on this
unenhanced scan. No hydrocephalus. The gray and white-matter
differentiation is preserved.
IMPRESSION: No acute intracranial abnormality.

## 2015-11-05 MED ORDER — ENOXAPARIN SODIUM 40 MG/0.4ML ~~LOC~~ SOLN
40.0000 mg | Freq: Once | SUBCUTANEOUS | Status: AC
  Administered 2015-11-05: 40 mg via SUBCUTANEOUS
  Filled 2015-11-05: qty 0.4

## 2015-11-05 MED ORDER — SODIUM CHLORIDE 0.9% FLUSH
3.0000 mL | Freq: Two times a day (BID) | INTRAVENOUS | Status: DC
  Administered 2015-11-05 (×2): 3 mL via INTRAVENOUS

## 2015-11-05 MED ORDER — ASPIRIN EC 81 MG PO TBEC
81.0000 mg | DELAYED_RELEASE_TABLET | Freq: Every day | ORAL | Status: DC
  Administered 2015-11-05: 81 mg via ORAL
  Filled 2015-11-05: qty 1

## 2015-11-05 MED ORDER — IBUPROFEN 200 MG PO TABS: 200.0000 mg | ORAL_TABLET | Freq: Four times a day (QID) | ORAL | Status: DC | PRN

## 2015-11-05 MED ORDER — AMLODIPINE BESYLATE 5 MG PO TABS
5.0000 mg | ORAL_TABLET | Freq: Every day | ORAL | Status: DC
  Administered 2015-11-05: 5 mg via ORAL
  Filled 2015-11-05: qty 1

## 2015-11-05 MED ORDER — ALBUTEROL SULFATE HFA 108 (90 BASE) MCG/ACT IN AERS: 2.0000 | INHALATION_SPRAY | RESPIRATORY_TRACT | Status: DC | PRN

## 2015-11-05 MED ORDER — FLUTICASONE PROPIONATE 50 MCG/ACT NA SUSP
1.0000 | Freq: Every day | NASAL | Status: DC | PRN
  Filled 2015-11-05: qty 16

## 2015-11-05 MED ORDER — ONDANSETRON HCL 4 MG/2ML IJ SOLN
4.0000 mg | Freq: Four times a day (QID) | INTRAMUSCULAR | Status: DC | PRN
Start: 2015-11-05 — End: 2015-11-05

## 2015-11-05 MED ORDER — KETOROLAC TROMETHAMINE 30 MG/ML IJ SOLN
30.0000 mg | Freq: Four times a day (QID) | INTRAMUSCULAR | Status: AC | PRN
  Administered 2015-11-05: 30 mg via INTRAVENOUS
  Filled 2015-11-05 (×2): qty 1

## 2015-11-05 MED ORDER — OFLOXACIN 0.3 % OT SOLN: 5.0000 [drp] | Freq: Every day | OTIC | Status: DC

## 2015-11-05 MED ORDER — ALBUTEROL SULFATE (2.5 MG/3ML) 0.083% IN NEBU: 3.0000 mL | INHALATION_SOLUTION | Freq: Four times a day (QID) | RESPIRATORY_TRACT | Status: DC | PRN

## 2015-11-05 MED ORDER — ACETAMINOPHEN 325 MG PO TABS
650.0000 mg | ORAL_TABLET | Freq: Four times a day (QID) | ORAL | Status: DC | PRN
  Administered 2015-11-05: 650 mg via ORAL
  Filled 2015-11-05: qty 2

## 2015-11-05 MED ORDER — ENOXAPARIN SODIUM 40 MG/0.4ML ~~LOC~~ SOLN: 40.0000 mg | Freq: Every day | SUBCUTANEOUS | Status: DC

## 2015-11-05 MED ORDER — ALPRAZOLAM 0.25 MG PO TABS: 0.2500 mg | ORAL_TABLET | Freq: Two times a day (BID) | ORAL | Status: DC | PRN

## 2015-11-05 NOTE — H&P (Signed)
[facility] [place] [facility]   PATIENT NAME: Cindy Fisher    MR#:  [id number]  DATE OF BIRTH:  [birth year]  DATE OF ADMISSION:  11/04/2015  PRIMARY CARE PHYSICIAN: Jacquenette Shone, NP   REQUESTING/REFERRING PHYSICIAN: Jene Every, MD  CHIEF COMPLAINT:   Chief Complaint  Patient presents with  . Chest Pain    HISTORY OF PRESENT ILLNESS:  Cindy Fisher  is a 55 y.o. female with a known history of asthma, hypertension, heart murmur who comes in the emergency department referred from the [facility] urgent care center due to chest pain since 1400.  Per patient, the pain happened while she was around loss. She states that she was able to finish her shift and went home, but decided to come to the urgent care center since the pain was persistent and worsening. She describes the pain as precordial, nonradiating, pressure-like, associated with dyspnea, nausea, palpitations. There are no relieving and the pain worsens with deep inspiration. She denies previous dyspnea on exertion, orthopnea, PND, or pitting edema of the lower extremities. She denies history of hyperlipidemia, hyperglycemia, tobacco or alcohol use. Her father, aunt and grandmother have unspecified history of heart disease.  At the urgent care center, the patient received sublingual nitroglycerin without much relief. The pain is REPRODUCIBLE and constant. Toradol 30 mg IVP seems to have partially relieved the pain. When seen in the ER, the patient declined further analgesics. She was in no acute distress.  PAST MEDICAL HISTORY:   Past Medical History  Diagnosis Date  . Asthma   . Hypertension   . Heart murmur     PAST SURGICAL HISTORY:   Past Surgical History  Procedure Laterality Date  . Abdominal hysterectomy    . Foot surgery Left   . Hand surgery Right     SOCIAL HISTORY:   Social History  Substance Use Topics  . Smoking status: Never Smoker   . Smokeless tobacco: Not on file  .  Alcohol Use: No    FAMILY HISTORY:  History reviewed. No pertinent family history.  DRUG ALLERGIES:   Allergies  Allergen Reactions  . Codeine Hives  . Penicillins Hives and Other (See Comments)      . Percocet [Oxycodone-Acetaminophen] Nausea And Vomiting  . Sulfa Antibiotics Nausea And Vomiting  . Vicodin [Hydrocodone-Acetaminophen] Nausea And Vomiting    REVIEW OF SYSTEMS:   Review of Systems  Constitutional: Negative for fever and chills.  HENT: Negative for congestion, hearing loss and nosebleeds.   Eyes: Negative for blurred vision and double vision.  Respiratory: Negative for cough and hemoptysis.   Cardiovascular: Positive for chest pain and palpitations. Negative for orthopnea, claudication, leg swelling and PND.  Gastrointestinal: Positive for nausea. Negative for vomiting.  Genitourinary: Negative for dysuria, urgency, frequency, hematuria and flank pain.  Musculoskeletal: Negative for joint pain.  Neurological: Positive for dizziness. Negative for seizures.  Psychiatric/Behavioral: The patient does not have insomnia.     MEDICATIONS AT HOME:   Prior to Admission medications   Medication Sig Start Date End Date Taking? Authorizing Provider  acetaminophen (TYLENOL) 500 MG tablet Take 1,000 mg by mouth every 6 (six) hours as needed for mild pain or headache.   Yes Historical Provider, MD  albuterol (PROVENTIL HFA;VENTOLIN HFA) 108 (90 BASE) MCG/ACT inhaler Inhale 2 puffs into the lungs every 6 (six) hours as needed for wheezing or shortness of breath.    Yes Historical Provider, MD  amLODipine (NORVASC) 5 MG tablet Take 5 mg by  mouth daily.   Yes Historical Provider, MD  fluticasone (FLONASE) 50 MCG/ACT nasal spray Place 1 spray into both nostrils daily as needed for rhinitis.    Yes Historical Provider, MD  ciprofloxacin (CIPRO) 250 MG tablet Take 1 tablet (250 mg total) by mouth every 12 (twelve) hours. Patient not taking: Reported on 11/04/2015 07/12/15   Payton Mccallum, MD      VITAL SIGNS:  Blood pressure 137/77, pulse 57, temperature 97.9 F (36.6 C), temperature source Oral, resp. rate 16, height  (1.651 m), weight 61.689 kg (136 lb), SpO2 99 %.  PHYSICAL EXAMINATION:  Physical Exam  GENERAL:  55 y.o.-year-old patient lying in the bed with no acute distress.  EYES: Pupils equal, round, reactive to light and accommodation. No scleral icterus. Extraocular muscles intact.  HEENT: Head atraumatic, normocephalic. Oropharynx and nasopharynx clear.  NECK:  Supple, no jugular venous distention. No thyroid enlargement, no tenderness.  LUNGS: Normal breath sounds bilaterally, no wheezing, rales,rhonchi or crepitation. No use of accessory muscles of respiration.  CARDIOVASCULAR: S1, S2 normal. No murmurs, rubs, or gallops.  CHEST: Highly reproducible precordial chest pain. ABDOMEN: Soft, nontender, nondistended. Bowel sounds present. No organomegaly or mass.  EXTREMITIES: No pedal edema, cyanosis, or clubbing.  NEUROLOGIC: Cranial nerves II through XII are intact. Muscle strength 5/5 in all extremities. Sensation intact. Gait not checked.  PSYCHIATRIC: The patient is alert and oriented x 3.  SKIN: No obvious rash, lesion, or ulcer.   LABORATORY PANEL:   CBC  Recent Labs Lab 11/04/15 1924  WBC 8.9  HGB 13.7  HCT 41.7  PLT 258   ------------------------------------------------------------------------------------------------------------------  Chemistries   Recent Labs Lab 11/04/15 1924  NA 140  K 3.9  CL 104  CO2 28  GLUCOSE 98  BUN 12  CREATININE 0.84  CALCIUM 10.0  AST 20  ALT 13*  ALKPHOS 119  BILITOT 0.7   ------------------------------------------------------------------------------------------------------------------  Cardiac Enzymes  Recent Labs Lab 11/04/15 1911  TROPONINI <0.03   ------------------------------------------------------------------------------------------------------------------  RADIOLOGY:   Dg Chest 2 View  11/04/2015  CLINICAL DATA:  Chest pain EXAM: CHEST  2 VIEW COMPARISON:  05/03/2010 chest radiograph. FINDINGS: Stable cardiomediastinal silhouette with top normal heart size. No pneumothorax. No pleural effusion. Lungs appear clear, with no acute consolidative airspace disease and no pulmonary edema. IMPRESSION: No active cardiopulmonary disease. Electronically Signed   By: Delbert Phenix M.D.   On: 11/04/2015 19:55      IMPRESSION AND PLAN:   Patient Active Problem List   Diagnosis Date Noted  . Chest pain Admit to telemetry. Continue supplemental oxygen. Received aspirin earlier.  Start aspirin 81 mg by mouth daily.  Trend troponin levels. Check EKG and echocardiogram in the morning.  11/05/2015  . Hypertension Continue amlodipine 5 mg by mouth daily. Monitor blood pressure.  11/05/2015  . Asthma As symptomatic at this time. Bronchodilators as needed. 11/05/2015      All the records are reviewed and case discussed with ED provider. Management plans discussed with the patient, family and they are in agreement.  CODE STATUS: Full code.  TOTAL TIME TAKING CARE OF THIS PATIENT: 70 minutes.    Bobette Mo M.D on 11/05/2015 at 12:18 AM  Between 7am to 6pm - Pager - [phone]  After 6pm go to [email or web address] - password [email or web address]  [facility]  Office  [phone]  CC: Primary care physician; Jacquenette Shone, NP   Note: This dictation was prepared with Dragon dictation along with smaller phrase technology. Any transcriptional  errors that result from this process are unintentional.

## 2015-11-05 NOTE — Progress Notes (Signed)
Patient complaining of ear pain and would like some ear drops. Dr. Luberta Mutter on floor and notified.

## 2015-11-05 NOTE — Progress Notes (Signed)
Per Dr. Luberta Mutter, wait to discharge patient until ECHO has resulted. Still waiting a result. Patient is resting comfortably.

## 2015-11-05 NOTE — Discharge Summary (Signed)
Cindy Fisher, is a 55 y.o. female  DOB 03/14/1961  MRN [id number].  Admission date:  11/04/2015  Admitting Physician  Bobette Mo, MD  Discharge Date:  11/05/2015   Primary MD  Jacquenette Shone, NP  Recommendations for primary care physician for things to follow:  Patient can follow-up with her primary doctor in 1 week.   Admission Diagnosis  Chest pain, unspecified chest pain type [R07.9]   Discharge Diagnosis  Chest pain, unspecified chest pain type [R07.9]    Principal Problem:   Chest pain Active Problems:   Hypertension   Asthma      Past Medical History  Diagnosis Date  . Asthma   . Hypertension   . Heart murmur     Past Surgical History  Procedure Laterality Date  . Abdominal hysterectomy    . Foot surgery Left   . Hand surgery Right        History of present illness and  Hospital Course:     Kindly see H&P for history of present illness and admission details, please review complete Labs, Consult reports and Test reports for all details in brief  HPI  from the history and physical done on the day of admission  55 year old female patient with history of asthma, hypertension [facility] [place] [facility] because of chest pain. Patient admitted to telemetry for chest pain evaluation.   Hospital Course  #1 chest pain likely musculoskeletal origin troponins are negative for 3 times. Patient chest pain improved with IV Toradol. Patient did have echocardiogram but the results are pending. Echocardiogram is normal with normal ejection fraction without any valvular abnormalities patient can be discharged home advised her to take ibuprofen as outpatient the chest pain is highly reproducible does have chest wall tenderness. Does have some cough and  wheezing patient is requesting a albuterol  prescription.     Discharge Condition: stable.   Follow UP   follow-up with primary doctor in one week  Can follow with cardio as an out opt for stress test.   Discharge Instructions  and  Discharge Medications      Medication List    STOP taking these medications        ciprofloxacin 250 MG tablet  Commonly known as:  CIPRO      TAKE these medications        acetaminophen 500 MG tablet  Commonly known as:  TYLENOL  Take 1,000 mg by mouth every 6 (six) hours as needed for mild pain or headache.     albuterol 108 (90 Base) MCG/ACT inhaler  Commonly known as:  PROVENTIL HFA;VENTOLIN HFA  Inhale 2 puffs into the lungs every 4 (four) hours as needed for wheezing or shortness of breath.     amLODipine 5 MG tablet  Commonly known as:  NORVASC  Take 5 mg by mouth daily.     fluticasone 50 MCG/ACT nasal spray  Commonly known as:  FLONASE  Place 1 spray into both nostrils daily as needed for rhinitis.     ibuprofen 200 MG tablet  Commonly known as:  MOTRIN IB  Take 1 tablet (200 mg total) by mouth every 6 (six) hours as needed.          Diet and Activity recommendation: See Discharge Instructions above   Consults obtained - none   Major procedures and Radiology Reports - PLEASE review detailed and final reports for all details, in brief -      Dg Chest 2 View  11/04/2015  CLINICAL DATA:  Chest pain EXAM: CHEST  2 VIEW COMPARISON:  05/03/2010 chest radiograph. FINDINGS: Stable cardiomediastinal silhouette with top normal heart size. No pneumothorax. No pleural effusion. Lungs appear clear, with no acute consolidative airspace disease and no pulmonary edema. IMPRESSION: No active cardiopulmonary disease. Electronically Signed   By: Delbert Phenix M.D.   On: 11/04/2015 19:55    Micro Results     No results found for this or any previous visit (from the past 240 hour(s)).     Today   Subjective:   Cindy Fisher today has no headache,no chest abdominal  pain,no new weakness tingling or numbness, feels much better wants to go home today.   Objective:   Blood pressure 139/73, pulse 59, temperature 97.7 F (36.5 C), temperature source Oral, resp. rate 22, height  (1.626 m), weight 59.966 kg (132 lb 3.2 oz), SpO2 94 %.   Intake/Output Summary (Last 24 hours) at 11/05/15 1037 Last data filed at 11/05/15 0956  Gross per 24 hour  Intake    243 ml  Output    225 ml  Net     18 ml    Exam Awake Alert, Oriented x 3, No new F.N deficits, Normal affect [place].AT,PERRAL Supple Neck,No JVD, No cervical lymphadenopathy appriciated.  Symmetrical Chest wall movement, Good air movement bilaterally, CTAB RRR,No Gallops,Rubs or new Murmurs, No Parasternal Heave +ve B.Sounds, Abd Soft, Non tender, No organomegaly appriciated, No rebound -guarding or rigidity. No Cyanosis, Clubbing or edema, No new Rash or bruise  Data Review   CBC w Diff: Lab Results  Component Value Date   WBC 6.7 11/05/2015   WBC 4.6 03/03/2014   HGB 12.9 11/05/2015   HGB 12.7 03/03/2014   HCT 37.1 11/05/2015   HCT 38.8 03/03/2014   PLT 224 11/05/2015   PLT 226 03/03/2014   LYMPHOPCT 30.2 03/03/2014   MONOPCT 8.6 03/03/2014   EOSPCT 4.3 03/03/2014   BASOPCT 1.3 03/03/2014    CMP: Lab Results  Component Value Date   NA 140 11/04/2015   NA 139 03/03/2014   K 3.9 11/04/2015   K 3.7 03/03/2014   CL 104 11/04/2015   CL 103 03/03/2014   CO2 28 11/04/2015   CO2 30 03/03/2014   BUN 12 11/04/2015   BUN 12 03/03/2014   CREATININE 0.67 11/05/2015   CREATININE 0.86 03/03/2014   PROT 8.3* 11/04/2015   PROT 7.8 03/03/2014   ALBUMIN 4.7 11/04/2015   ALBUMIN 3.9 03/03/2014   BILITOT 0.7 11/04/2015   BILITOT 0.5 03/03/2014   ALKPHOS 119 11/04/2015   ALKPHOS 96 03/03/2014   AST 20 11/04/2015   AST 17 03/03/2014   ALT 13* 11/04/2015   ALT 19 03/03/2014  .   Total Time in preparing paper work, data evaluation and todays exam - 35 minutes  Ryley Teater M.D  on 11/05/2015 at 10:37 AM    Note: This dictation was prepared with Dragon dictation along with smaller phrase technology. Any transcriptional errors that result from this process are unintentional.

## 2015-11-05 NOTE — ED Notes (Signed)
Called floor to let them know pt on the way up 

## 2015-11-05 NOTE — Progress Notes (Signed)
Dr. Juliann Pares called and stated ECHO was normal. MD stated something is wrong with the system and he can't get the result in the computer. Discharge instructions given. IV and tele removed. Prescriptions sent to [facility] pharmacy. Education given on chest pain and new ear drops. No questions at this time. Daughter is here to pick patient up.

## 2016-01-02 ENCOUNTER — Encounter: Payer: Self-pay | Admitting: *Deleted

## 2016-01-02 ENCOUNTER — Ambulatory Visit
Admission: EM | Admit: 2016-01-02 | Discharge: 2016-01-02 | Disposition: A | Discharge status: Home / Self Care | Payer: Managed Care, Other (non HMO) | Attending: Family Medicine | Admitting: Family Medicine

## 2016-01-02 DIAGNOSIS — J309 Allergic rhinitis, unspecified: Secondary | ICD-10-CM | POA: Diagnosis not present

## 2016-01-02 LAB — RAPID INFLUENZA A&B ANTIGENS (ARMC ONLY): INFLUENZA A (ARMC): NEGATIVE

## 2016-01-02 LAB — RAPID INFLUENZA A&B ANTIGENS: Influenza B (ARMC): NEGATIVE

## 2016-01-02 MED ORDER — BUDESONIDE 32 MCG/ACT NA SUSP: 2.0000 | Freq: Every day | NASAL | Status: DC

## 2016-01-02 NOTE — ED Notes (Signed)
Productive cough- yellow, fever, headache, runny nose, and bilat ear pain since Friday.

## 2016-01-02 NOTE — ED Notes (Signed)
Pt refused rapid strep test.

## 2016-01-02 NOTE — ED Provider Notes (Signed)
CSN: [id number]     Arrival date & time 01/02/16  1231 History   First MD Initiated Contact with Patient 01/02/16 1344     Chief Complaint  Patient presents with  . Cough  . Otalgia  . Headache  . Nasal Congestion  . Fever   (Consider location/radiation/quality/duration/timing/severity/associated sxs/prior Treatment) HPI Comments: 55 yo female with a h/o seasonal allergies presents with worsening allergy symptoms for the last 3 days. States took a claritin last night which helped some. Complains of ear pressure, sneezing, congestion. Denies any fevers, wheezing, chest pains or shortness of breath.   The history is provided by the patient.    Past Medical History  Diagnosis Date  . Asthma   . Hypertension   . Heart murmur    Past Surgical History  Procedure Laterality Date  . Abdominal hysterectomy    . Foot surgery Left   . Hand surgery Right    History reviewed. No pertinent family history. Social History  Substance Use Topics  . Smoking status: Never Smoker   . Smokeless tobacco: None  . Alcohol Use: No   OB History    No data available     Review of Systems  Allergies  Codeine; Penicillins; Percocet; Sulfa antibiotics; and Vicodin  Home Medications   Prior to Admission medications   Medication Sig Start Date End Date Taking? Authorizing Provider  acetaminophen (TYLENOL) 500 MG tablet Take 1,000 mg by mouth every 6 (six) hours as needed for mild pain or headache.   Yes Historical Provider, MD  albuterol (PROVENTIL HFA;VENTOLIN HFA) 108 (90 Base) MCG/ACT inhaler Inhale 2 puffs into the lungs every 4 (four) hours as needed for wheezing or shortness of breath. 11/05/15  Yes Katha Hamming, MD  amLODipine (NORVASC) 5 MG tablet Take 5 mg by mouth daily.   Yes Historical Provider, MD  budesonide (RHINOCORT AQUA) 32 MCG/ACT nasal spray Place 2 sprays into both nostrils daily. 01/02/16   Payton Mccallum, MD  fluticasone (FLONASE) 50 MCG/ACT nasal spray Place 1 spray  into both nostrils daily as needed for rhinitis.     Historical Provider, MD  ibuprofen (MOTRIN IB) 200 MG tablet Take 1 tablet (200 mg total) by mouth every 6 (six) hours as needed. 11/05/15   Katha Hamming, MD  ofloxacin (FLOXIN OTIC) 0.3 % otic solution Place 5 drops into the right ear daily. 11/05/15   Katha Hamming, MD   Meds Ordered and Administered this Visit  Medications - No data to display  BP 126/84 mmHg  Pulse 83  Temp(Src) 99.2 F (37.3 C) (Oral)  Resp 16  Ht  (1.626 m)  Wt 120 lb (54.432 kg)  BMI 20.59 kg/m2  SpO2 100% No data found.   Physical Exam  Constitutional: She appears well-developed and well-nourished. No distress.  HENT:  Head: Normocephalic and atraumatic.  Right Ear: Tympanic membrane, external ear and ear canal normal.  Left Ear: Tympanic membrane, external ear and ear canal normal.  Nose: Mucosal edema and rhinorrhea present. No nose lacerations, sinus tenderness, nasal deformity, septal deviation or nasal septal hematoma. No epistaxis.  No foreign bodies.  Mouth/Throat: Uvula is midline, oropharynx is clear and moist and mucous membranes are normal. No oropharyngeal exudate, posterior oropharyngeal edema, posterior oropharyngeal erythema or tonsillar abscesses.  Eyes: Conjunctivae and EOM are normal. Pupils are equal, round, and reactive to light. Right eye exhibits no discharge. Left eye exhibits no discharge. No scleral icterus.  Neck: Normal range of motion. Neck supple. No thyromegaly  present.  Cardiovascular: Normal rate, regular rhythm and normal heart sounds.   Pulmonary/Chest: Effort normal and breath sounds normal. No respiratory distress. She has no wheezes. She has no rales.  Lymphadenopathy:    She has no cervical adenopathy.  Skin: She is not diaphoretic.  Nursing note and vitals reviewed.   ED Course  Procedures (including critical care time)  Labs Review Labs Reviewed  RAPID INFLUENZA A&B ANTIGENS ([facility] ONLY)   RAPID [facility] SCREEN [facility])    Imaging Review No results found.   Visual Acuity Review  Right Eye Distance:   Left Eye Distance:   Bilateral Distance:    Right Eye Near:   Left Eye Near:    Bilateral Near:         MDM   1. Allergic rhinitis, unspecified allergic rhinitis type    Discharge Medication List as of 01/02/2016  1:57 PM    START taking these medications   Details  budesonide (RHINOCORT AQUA) 32 MCG/ACT nasal spray Place 2 sprays into both nostrils daily., Starting 01/02/2016, Until Discontinued, Normal       1. diagnosis reviewed with patient 2. rx as per orders above; reviewed possible side effects, interactions, risks and benefits  3. Recommend supportive treatment with otc antihistamines, increased fluids 4. Follow-up prn if symptoms worsen or don't improve    Payton Mccallumrlando Orella Cushman, MD 01/02/16 1402

## 2016-04-25 ENCOUNTER — Encounter: Payer: Self-pay | Admitting: *Deleted

## 2016-04-25 ENCOUNTER — Ambulatory Visit
Admission: EM | Admit: 2016-04-25 | Discharge: 2016-04-25 | Disposition: A | Discharge status: Home / Self Care | Payer: Managed Care, Other (non HMO) | Attending: Family Medicine | Admitting: Family Medicine

## 2016-04-25 DIAGNOSIS — R300 Dysuria: Secondary | ICD-10-CM | POA: Diagnosis not present

## 2016-04-25 DIAGNOSIS — S39011A Strain of muscle, fascia and tendon of abdomen, initial encounter: Secondary | ICD-10-CM

## 2016-04-25 DIAGNOSIS — S39012A Strain of muscle, fascia and tendon of lower back, initial encounter: Secondary | ICD-10-CM

## 2016-04-25 DIAGNOSIS — Z8679 Personal history of other diseases of the circulatory system: Secondary | ICD-10-CM

## 2016-04-25 DIAGNOSIS — IMO0001 Reserved for inherently not codable concepts without codable children: Secondary | ICD-10-CM

## 2016-04-25 DIAGNOSIS — R350 Frequency of micturition: Secondary | ICD-10-CM

## 2016-04-25 DIAGNOSIS — R030 Elevated blood-pressure reading, without diagnosis of hypertension: Secondary | ICD-10-CM | POA: Diagnosis not present

## 2016-04-25 LAB — URINALYSIS COMPLETE WITH MICROSCOPIC (ARMC ONLY)
BACTERIA UA: NONE SEEN
Bilirubin Urine: NEGATIVE
Glucose, UA: NEGATIVE mg/dL
Hgb urine dipstick: NEGATIVE
KETONES UR: NEGATIVE mg/dL
Nitrite: NEGATIVE
PH: 5.5 (ref 5.0–8.0)
PROTEIN: NEGATIVE mg/dL
Specific Gravity, Urine: 1.025 (ref 1.005–1.030)

## 2016-04-25 MED ORDER — PHENAZOPYRIDINE HCL 100 MG PO TABS: 100.0000 mg | ORAL_TABLET | Freq: Three times a day (TID) | ORAL | 0 refills | Status: DC | PRN

## 2016-04-25 MED ORDER — ORPHENADRINE CITRATE ER 100 MG PO TB12: 100.0000 mg | ORAL_TABLET | Freq: Two times a day (BID) | ORAL | 0 refills | Status: DC

## 2016-04-25 NOTE — ED Provider Notes (Signed)
CSN: [id number]     Arrival date & time 04/25/16  1344 History   None    Chief Complaint  Patient presents with  . Dysuria  . Urinary Frequency  . Urinary Urgency  . Abdominal Pain  . Flank Pain   (Consider location/radiation/quality/duration/timing/severity/associated sxs/prior Treatment) Pt c/o urinary frequency, ? Right sided back pain and possible RUQ twinge per pt report. Pt is very vague with her symptom presentation, clarification, and timeline: states symptoms s"tarted day before yesterday" and upon clarification patient states symptoms "maybe started 3 days ago not 2 days ago, really not sure". Pt denies N,V,D, or fever. Denies sexual activity states "the Shaune Pollack is my only partner".   Upon further inquiry pt states she has been working out back and abs recently at gym.    The history is provided by the patient.  Dysuria  Pain quality:  Burning Pain severity:  Unable to specify Onset quality:  Gradual Duration:  3 days Timing:  Intermittent Progression:  Waxing and waning Chronicity:  New Recent urinary tract infections: no   Relieved by:  Nothing Worsened by:  Nothing Ineffective treatments: water. Urinary symptoms: frequent urination   Urinary symptoms: no discolored urine, no foul-smelling urine, no hematuria, no hesitancy and no bladder incontinence   Associated symptoms: flank pain   Associated symptoms: no fever, no nausea, no vaginal discharge and no vomiting   Risk factors: no hx of pyelonephritis, no hx of urolithiasis, no recurrent urinary tract infections, not sexually active and no sexually transmitted infections     Past Medical History:  Diagnosis Date  . Asthma   . Heart murmur   . Hypertension    Past Surgical History:  Procedure Laterality Date  . ABDOMINAL HYSTERECTOMY    . FOOT SURGERY Left   . HAND SURGERY Right    History reviewed. No pertinent family history. Social History  Substance Use Topics  . Smoking status: Never Smoker  .  Smokeless tobacco: Never Used  . Alcohol use No   OB History    No data available     Review of Systems  Constitutional: Negative.  Negative for chills, fatigue and fever.  HENT: Negative.   Eyes: Negative.   Respiratory: Negative.   Cardiovascular: Negative.   Gastrointestinal: Negative for nausea and vomiting.       ? Mild RUQ discomfort(nonspecific)  Genitourinary: Positive for dysuria, flank pain and frequency. Negative for difficulty urinating, hematuria, pelvic pain, urgency, vaginal bleeding, vaginal discharge and vaginal pain.  Musculoskeletal: Positive for back pain. Negative for joint swelling.  Skin: Negative for rash.  Allergic/Immunologic: Negative.   Neurological: Negative.   Hematological: Negative.   Psychiatric/Behavioral: Negative.   All other systems reviewed and are negative.   Allergies  Codeine; Penicillins; Percocet [oxycodone-acetaminophen]; Sulfa antibiotics; and Vicodin [hydrocodone-acetaminophen]  Home Medications   Prior to Admission medications   Medication Sig Start Date End Date Taking? Authorizing Provider  acetaminophen (TYLENOL) 500 MG tablet Take 1,000 mg by mouth every 6 (six) hours as needed for mild pain or headache.   Yes Historical Provider, MD  albuterol (PROVENTIL HFA;VENTOLIN HFA) 108 (90 Base) MCG/ACT inhaler Inhale 2 puffs into the lungs every 4 (four) hours as needed for wheezing or shortness of breath. 11/05/15  Yes Katha Hamming, MD  amLODipine (NORVASC) 5 MG tablet Take 5 mg by mouth daily.   Yes Historical Provider, MD  fluticasone (FLONASE) 50 MCG/ACT nasal spray Place 1 spray into both nostrils daily as needed for rhinitis.  Historical Provider, MD  ibuprofen (MOTRIN IB) 200 MG tablet Take 1 tablet (200 mg total) by mouth every 6 (six) hours as needed. 11/05/15   Katha HammingSnehalatha Konidena, MD  ofloxacin (FLOXIN OTIC) 0.3 % otic solution Place 5 drops into the right ear daily. 11/05/15   Katha HammingSnehalatha Konidena, MD  orphenadrine  (NORFLEX) 100 MG tablet Take 1 tablet (100 mg total) by mouth 2 (two) times daily. 04/25/16   Clancy GourdJeanette Eagan Shifflett, NP  phenazopyridine (PYRIDIUM) 100 MG tablet Take 1 tablet (100 mg total) by mouth 3 (three) times daily as needed for pain. 04/25/16   Clancy GourdJeanette Donia Yokum, NP   Meds Ordered and Administered this Visit  Medications - No data to display  BP 140/81 (BP Location: Left Arm)   Pulse 61   Temp 97.9 F (36.6 C) (Oral)   Resp 16   Ht 5\' 4"  (1.626 m)   Wt 130 lb (59 kg)   SpO2 100%   BMI 22.31 kg/m  No data found.   Physical Exam  Constitutional: She is oriented to person, place, and time. Vital signs are normal. She appears well-developed and well-nourished. She is active.  Non-toxic appearance. She does not have a sickly appearance. She does not appear ill. No distress.  HENT:  Head: Normocephalic.  Eyes: Conjunctivae and lids are normal. Pupils are equal, round, and reactive to light.  Neck: Trachea normal and normal range of motion.  Cardiovascular: Normal rate, regular rhythm, normal heart sounds and normal pulses.   Pulmonary/Chest: Effort normal.  Abdominal: Soft. Bowel sounds are normal. She exhibits no distension and no mass. There is no tenderness. There is no rebound and no guarding. No hernia.  Musculoskeletal: Normal range of motion.       Lumbar back: She exhibits tenderness, pain and spasm. She exhibits normal range of motion, no bony tenderness, no swelling, no edema, no deformity, no laceration and normal pulse.  -SLE bilaterally, full ROM MAEW x 4.   -CVAT bilaterally with palpation, percussion  Neurological: She is alert and oriented to person, place, and time. She has normal strength. No cranial nerve deficit or sensory deficit. GCS eye subscore is 4. GCS verbal subscore is 5. GCS motor subscore is 6.  Skin: Skin is warm, dry and intact.  Psychiatric: She has a normal mood and affect. Her speech is normal.  Nursing note and vitals reviewed.   Urgent Care Course    Clinical Course    Procedures (including critical care time)  Labs Review Labs Reviewed  URINALYSIS COMPLETEWITH MICROSCOPIC ([facility] ONLY) - Abnormal; Notable for the following:       Result Value   Leukocytes, UA SMALL (*)    Squamous Epithelial / LPF 6-30 (*)    All other components within normal limits  URINE CULTURE   -glucose in UA, -nitrates, will cx No results found.       MDM  1420: Ua ordered, results pending. 1450: Reviewed labs with pt, will culture UA, will treat for muscle spasm with Norflex as directed and Pyridium as directed for dysuria. Pt verbalized understanding to this provider.  DDX: kidney stone, UTI, STD   1. Urinary frequency   2. Dysuria   3. Abdominal muscle strain, initial encounter   4. Strain of muscle, fascia and tendon of lower back, initial encounter   5. Hx of essential hypertension   6. Elevated blood pressure       Clancy GourdJeanette Naszir Cott, NP 04/25/16 1533    Clancy GourdJeanette Sebrena Engh, NP 04/25/16 1535

## 2016-04-25 NOTE — ED Triage Notes (Signed)
Dysuria, right flank pain, RLQ abd pain, urinary freq/urge, x2 days .Denies fever.

## 2016-04-25 NOTE — Discharge Instructions (Signed)
Discussed negative UA with pt, will culture. We will notify you if abx are needed and will call them in for you, pending cx results. Prescribed muscle relaxer for muscle strain(back/abs) most likely from recent work outs. Push plenty of fluids, take BP med as directed(did not take today). [facility] recheck of general medical issues(recheck BP, muscle strain, UC visit). Follow up with ER immediately if your develop fever, nausea, vomiting or worsening back/abdominal pain including but not limited to (loss of function, saddle numbness, loss of bowl and bladder).

## 2016-11-04 ENCOUNTER — Ambulatory Visit
Admission: EM | Admit: 2016-11-04 | Discharge: 2016-11-04 | Disposition: A | Discharge status: Home / Self Care | Payer: Managed Care, Other (non HMO) | Attending: Family Medicine | Admitting: Family Medicine

## 2016-11-04 DIAGNOSIS — R05 Cough: Secondary | ICD-10-CM | POA: Diagnosis not present

## 2016-11-04 DIAGNOSIS — R69 Illness, unspecified: Secondary | ICD-10-CM | POA: Diagnosis not present

## 2016-11-04 DIAGNOSIS — J111 Influenza due to unidentified influenza virus with other respiratory manifestations: Secondary | ICD-10-CM

## 2016-11-04 DIAGNOSIS — R059 Cough, unspecified: Secondary | ICD-10-CM

## 2016-11-04 DIAGNOSIS — J0100 Acute maxillary sinusitis, unspecified: Secondary | ICD-10-CM

## 2016-11-04 LAB — RAPID STREP SCREEN (MED CTR MEBANE ONLY): Streptococcus, Group A Screen (Direct): NEGATIVE

## 2016-11-04 MED ORDER — DOXYCYCLINE HYCLATE 100 MG PO CAPS: 100.0000 mg | ORAL_CAPSULE | Freq: Two times a day (BID) | ORAL | 0 refills | Status: DC

## 2016-11-04 MED ORDER — BENZONATATE 100 MG PO CAPS: 100.0000 mg | ORAL_CAPSULE | Freq: Three times a day (TID) | ORAL | 0 refills | Status: DC | PRN

## 2016-11-04 NOTE — Discharge Instructions (Signed)
Take medication as prescribed. Rest. Drink plenty of fluids. Take over the counter coricidin (for blood pressure patients), as discussed as needed for cough congestion.  Follow up with your primary care physician this week as needed. Return to Urgent care for new or worsening concerns.

## 2016-11-04 NOTE — ED Triage Notes (Signed)
Onset week nasal congestion, HA when pt cough, earaches both side, sore throat, chills

## 2016-11-04 NOTE — ED Provider Notes (Signed)
[facility] ____________________________________________  Time seen: Approximately 6:04 PM  I have reviewed the triage vital signs and the nursing notes.   HISTORY  Chief Complaint Cough   HPI Cindy Fisher is a 56 y.o. female presenting for the complaints of 6 days of runny nose, nasal congestion, cough, chills, body aches. Patient reports quick onset of symptoms. Patient reports symptoms have continued. 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 Patient denies known fevers, but reports she felt like she had a fever. Reports has been taking over-the-counter cough and congestion medication intermittently, but reports stopped due to concern is can increase her blood pressure. Patient reports cough intermittently waking her up at night. States occasional scratchy throat, denies sore throat. Reports continues to drink fluids well, mild decrease in appetite.  Denies chest pain, shortness of breath, abdominal pain, dysuria, extremity pain, extremity swelling or rash. Denies recent sickness. Denies recent antibiotic use. Denies renal insufficiency.  No LMP recorded. Patient has had a hysterectomy.  Jacquenette Shone, NP: PCP   Past Medical History:  Diagnosis Date  . Asthma   . Heart murmur   . Hypertension     Patient Active Problem List   Diagnosis Date Noted  . Chest pain 11/05/2015  . Hypertension 11/05/2015  . Asthma 11/05/2015    Past Surgical History:  Procedure Laterality Date  . ABDOMINAL HYSTERECTOMY    . FOOT SURGERY Left   . HAND SURGERY Right      No current facility-administered medications for this encounter.   Current Outpatient Prescriptions:  .  acetaminophen (TYLENOL) 500 MG tablet, Take 1,000 mg by mouth every 6 (six) hours as needed for mild pain or headache., Disp: , Rfl:  .  albuterol (PROVENTIL HFA;VENTOLIN HFA) 108 (90 Base) MCG/ACT inhaler, Inhale 2 puffs into the lungs every 4 (four)  hours as needed for wheezing or shortness of breath., Disp: 1 Inhaler, Rfl: 1 .  amLODipine (NORVASC) 5 MG tablet, Take 5 mg by mouth daily., Disp: , Rfl:  .  fluticasone (FLONASE) 50 MCG/ACT nasal spray, Place 1 spray into both nostrils daily as needed for rhinitis. , Disp: , Rfl:  .  ibuprofen (MOTRIN IB) 200 MG tablet, Take 1 tablet (200 mg total) by mouth every 6 (six) hours as needed., Disp: 30 tablet, Rfl: 0 .  ofloxacin (FLOXIN OTIC) 0.3 % otic solution, Place 5 drops into the right ear daily., Disp: 5 mL, Rfl: 0 .  orphenadrine (NORFLEX) 100 MG tablet, Take 1 tablet (100 mg total) by mouth 2 (two) times daily., Disp: 10 tablet, Rfl: 0 .  phenazopyridine (PYRIDIUM) 100 MG tablet, Take 1 tablet (100 mg total) by mouth 3 (three) times daily as needed for pain., Disp: 10 tablet, Rfl: 0 .  benzonatate (TESSALON PERLES) 100 MG capsule, Take 1 capsule (100 mg total) by mouth 3 (three) times daily as needed for cough., Disp: 15 capsule, Rfl: 0 .  doxycycline (VIBRAMYCIN) 100 MG capsule, Take 1 capsule (100 mg total) by mouth 2 (two) times daily., Disp: 20 capsule, Rfl: 0  Allergies Codeine; Penicillins; Percocet [oxycodone-acetaminophen]; Sulfa antibiotics; and Vicodin [hydrocodone-acetaminophen]  No family history on file.  Social History Social History  Substance Use Topics  . Smoking status: Never Smoker  . Smokeless tobacco: Never Used  . Alcohol use No    Review of Systems Constitutional: As above Eyes: No visual changes. ENT: No sore throat. Cardiovascular: Denies chest pain.  Respiratory: Denies shortness of breath. Gastrointestinal: No abdominal pain.  No nausea, no vomiting.  No diarrhea.  No constipation. Genitourinary: Negative for dysuria. Musculoskeletal: Negative for back pain. Skin: Negative for rash. Neurological: Negative for headaches, focal weakness or numbness.  10-point ROS otherwise negative.  ____________________________________________   PHYSICAL  EXAM:  VITAL SIGNS: ED Triage Vitals  Enc Vitals Group     BP 11/04/16 1633 (!) 156/79     Pulse Rate 11/04/16 1633 68     Resp 11/04/16 1633 16     Temp 11/04/16 1633 98.5 F (36.9 C)     Temp Source 11/04/16 1633 Oral     SpO2 11/04/16 1633 100 %     Weight 11/04/16 1631 135 lb (61.2 kg)     Height 11/04/16 1631 5\' 5"  (1.651 m)     Head Circumference --      Peak Flow --      Pain Score --      Pain Loc --      Pain Edu? --      Excl. in GC? --    Constitutional: Alert and oriented. Well appearing and in no acute distress. Eyes: Conjunctivae are normal. PERRL. EOMI. Head: Atraumatic.Mild tenderness to palpation bilateral frontal and mild to moderate tenderness bilateral maxillary sinuses. No swelling. No erythema.   Ears: no erythema, normal TMs bilaterally.   Nose: nasal congestion with bilateral nasal turbinate erythema and edema.   Mouth/Throat: Mucous membranes are moist.  Oropharynx non-erythematous.No tonsillar swelling or exudate.  Neck: No stridor.  No cervical spine tenderness to palpation. Hematological/Lymphatic/Immunilogical: No cervical lymphadenopathy. Cardiovascular: Normal rate, regular rhythm. Grossly normal heart sounds.  Good peripheral circulation. Respiratory: Normal respiratory effort.  No retractions. No wheezes, rales or rhonchi. Good air movement. Dry intermittent cough noted room. Gastrointestinal: Soft and nontender. No distention.  Musculoskeletal: No lower extremity tenderness nor edema noted bilaterally. No cervical, thoracic or lumbar tenderness to palpation.  Neurologic:  Normal speech and language. No gait instability. Skin:  Skin is warm, dry and intact. No rash noted. Psychiatric: Mood and affect are normal. Speech and behavior are normal. _________________________________________   LABS (all labs ordered are listed, but only abnormal results are displayed)  Labs Reviewed  RAPID STREP SCREEN (NOT AT [facility])  CULTURE, GROUP A STREP [facility])      PROCEDURES Procedures    INITIAL IMPRESSION / ASSESSMENT AND PLAN / ED COURSE  Pertinent labs & imaging results that were available during my care of the patient were reviewed by me and considered in my medical decision making (see chart for details).  Well-appearing patient. No acute distress. Discussed with patient, suspect patient started out with influenza. Suspect maxillary sinusitis and continued cough. Will treat patient with oral doxycycline and when necessary Tessalon Perles. Encouraged supportive care, rest, fluids and PCP follow up as needed. Work note given for today and tomorrow.  Discussed follow up with Primary care physician this week. Discussed follow up and return parameters including no resolution or any worsening concerns. Patient verbalized understanding and agreed to plan.   ____________________________________________   FINAL CLINICAL IMPRESSION(S) / ED DIAGNOSES  Final diagnoses:  Acute maxillary sinusitis, recurrence not specified  Cough  Influenza-like illness     Discharge Medication List as of 11/04/2016  6:02 PM    START taking these medications   Details  benzonatate (TESSALON PERLES) 100 MG capsule Take 1 capsule (100 mg total) by mouth 3 (three) times daily as needed for cough., Starting Sun 11/04/2016,  Print    doxycycline (VIBRAMYCIN) 100 MG capsule Take 1 capsule (100 mg total) by mouth 2 (two) times daily., Starting Sun 11/04/2016, Print        Note: This dictation was prepared with Dragon dictation along with smaller phrase technology. Any transcriptional errors that result from this process are unintentional.         Renford Dills, NP 11/04/16 1931

## 2016-11-07 LAB — CULTURE, GROUP A STREP (THRC)

## 2016-11-23 IMAGING — CT CT HEAD W/O CM
2 series · 14 of 30 positions shown, 16 images · non-contrast
Comparison: Brain CT 03/03/2014

CLINICAL DATA: Patient status post fall. Trauma to the left hip and
site. Pain along the back of the head. No loss of consciousness.

EXAM:
CT HEAD WITHOUT CONTRAST
TECHNIQUE: Contiguous axial images were obtained from the base of the skull
through the vertex without intravenous contrast.

[Series 2: head wo · axial · 0.39mm/px · z∈[+375,+475]mm · 6 of 29 slices shown, 8 images]
[im 5/29  brain]
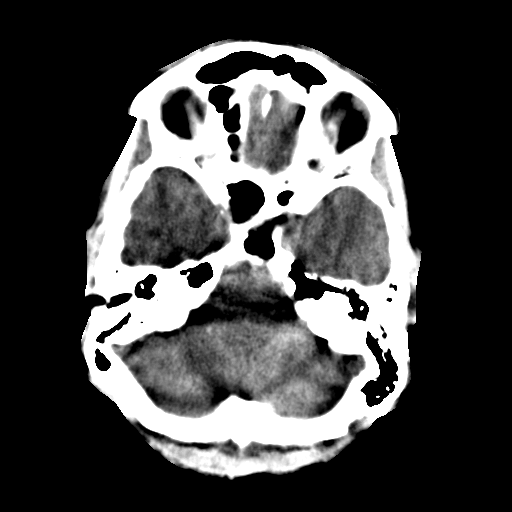
[im 5/29  bone]
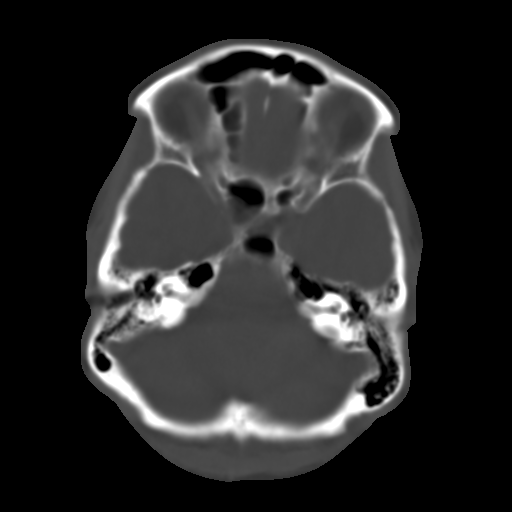
[im 9/29  brain]
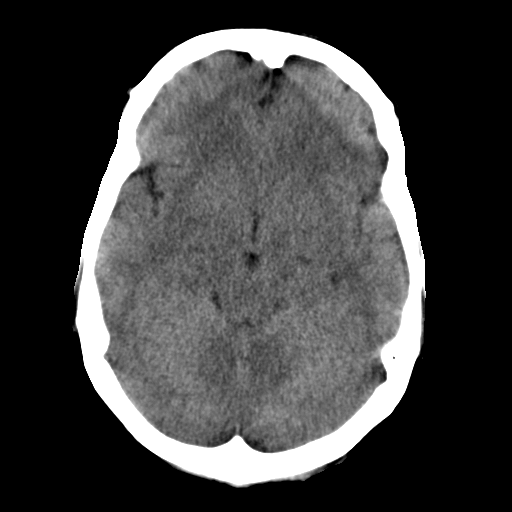
[im 13/29  brain]
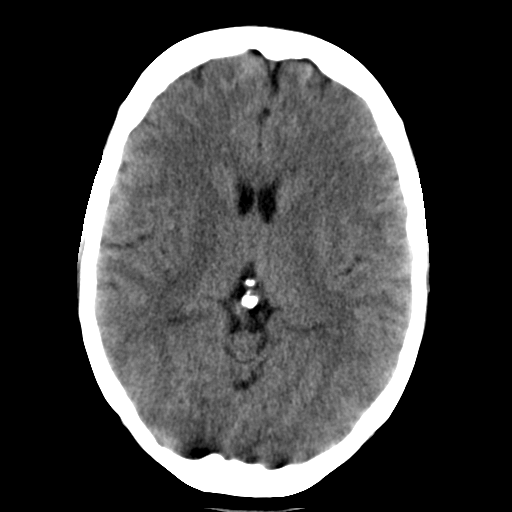
[im 17/29  brain]
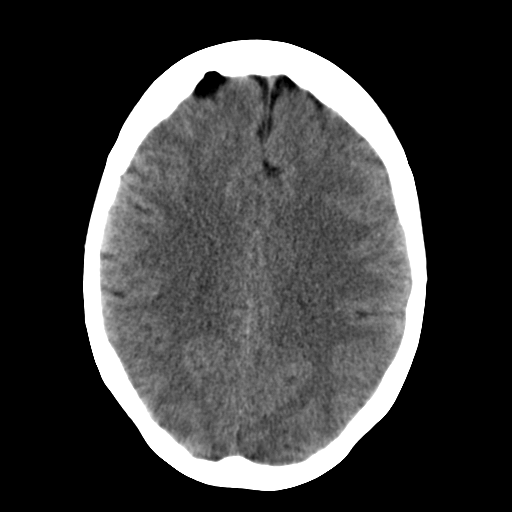
[im 21/29  brain]
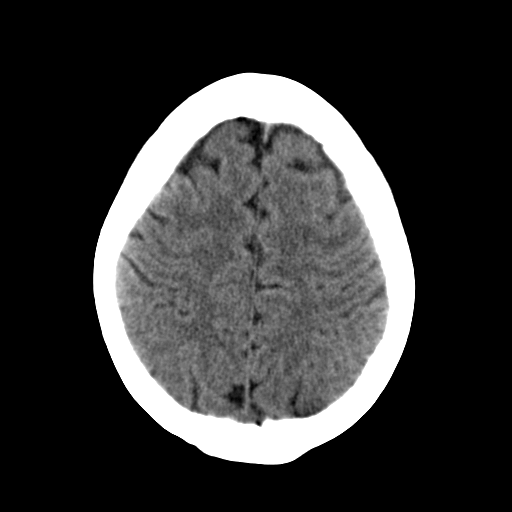
[im 21/29  bone]
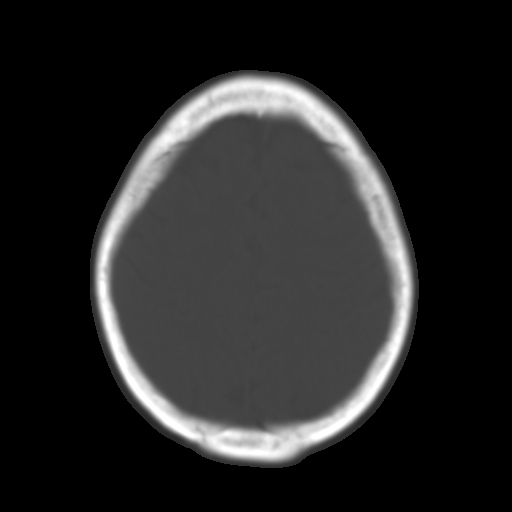
[im 25/29  brain]
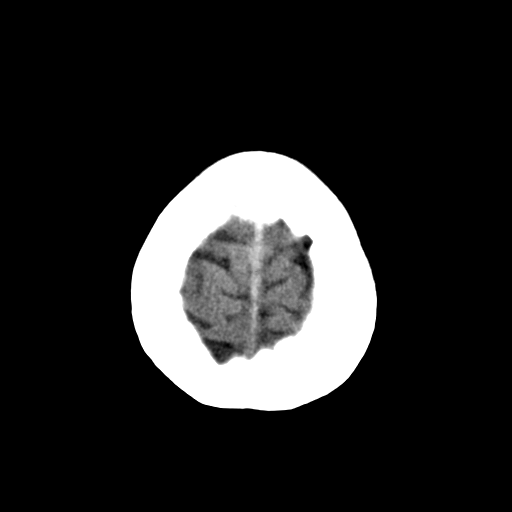

[Series 3: head bone · axial · 0.39mm/px · z∈[+359,+491]mm · 8 of 82 slices shown]
[im 8/82  bone]
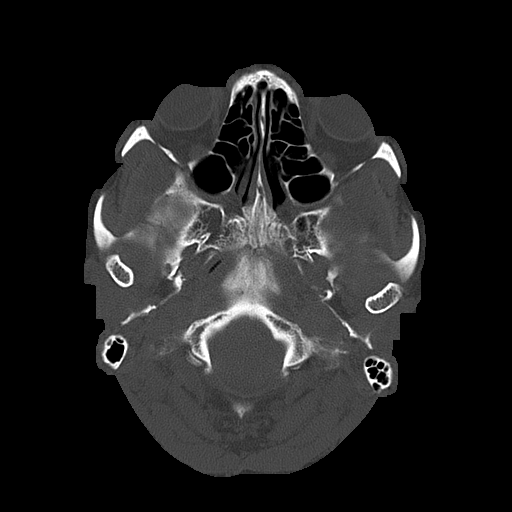
[im 16/82  bone]
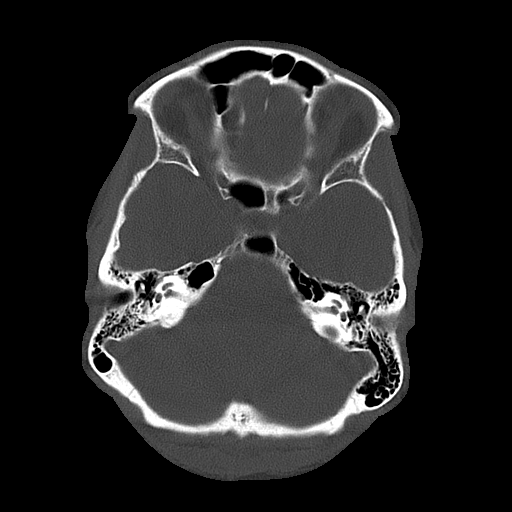
[im 28/82  bone]
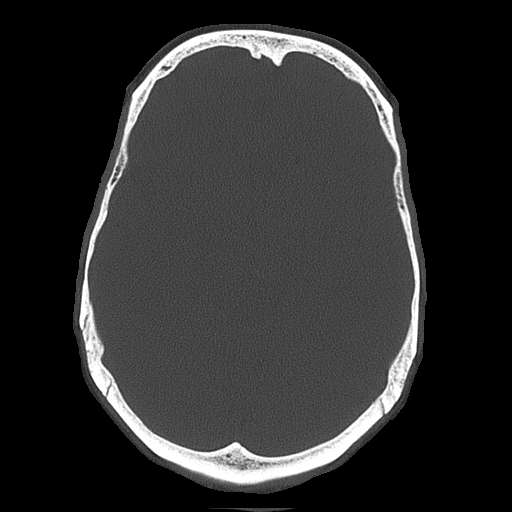
[im 35/82  bone]
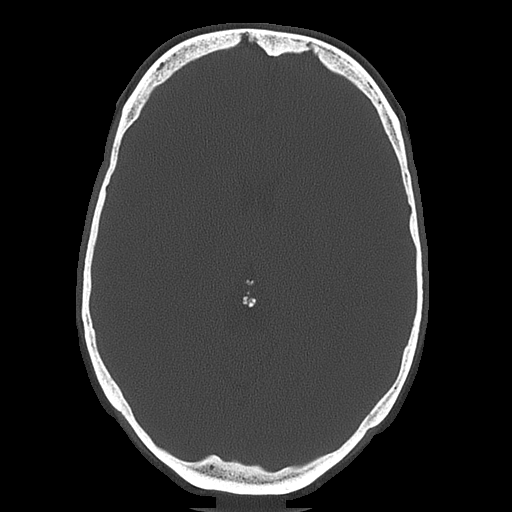
[im 47/82  bone]
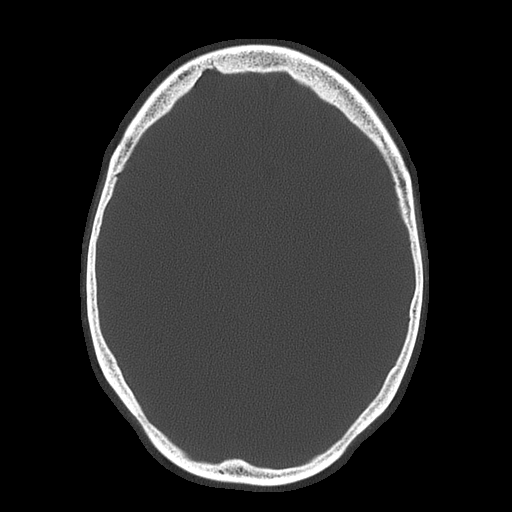
[im 55/82  bone]
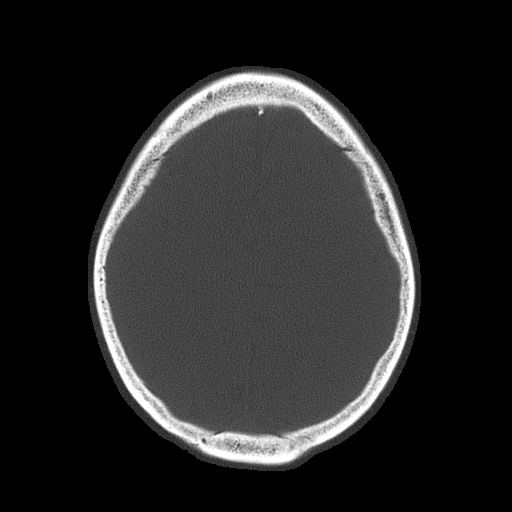
[im 66/82  bone]
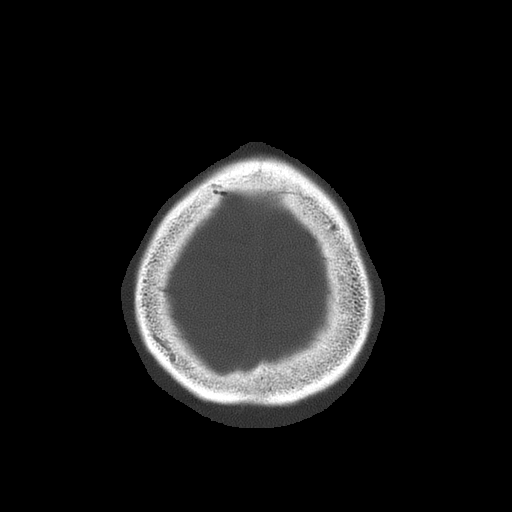
[im 74/82  bone]
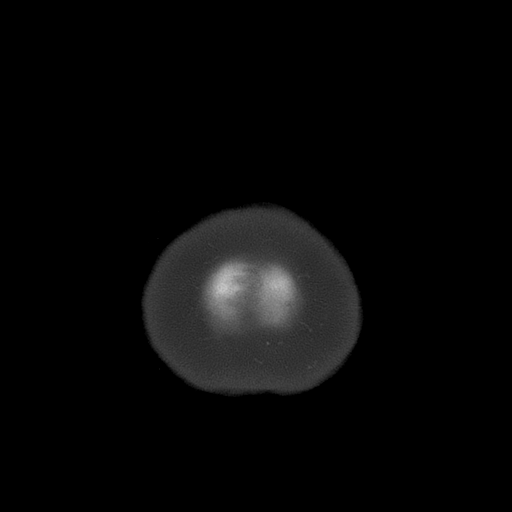

[14 of 30 positions shown; findings below may reference images not displayed]

FINDINGS: Ventricles and sulci are appropriate for patient's age. No evidence
for acute cortically based infarct, intracranial hemorrhage, mass
lesion or mass effect. Mucosal thickening within the inferior left
frontal sinus. Mastoid air cells are well aerated. Calvarium is
intact. Orbits unremarkable.
IMPRESSION: No acute intracranial process.

## 2017-05-16 ENCOUNTER — Ambulatory Visit
Admission: EM | Admit: 2017-05-16 | Discharge: 2017-05-16 | Disposition: A | Discharge status: Home / Self Care | Payer: Managed Care, Other (non HMO) | Attending: Family Medicine | Admitting: Family Medicine

## 2017-05-16 ENCOUNTER — Encounter: Payer: Self-pay | Admitting: Emergency Medicine

## 2017-05-16 DIAGNOSIS — J301 Allergic rhinitis due to pollen: Secondary | ICD-10-CM

## 2017-05-16 DIAGNOSIS — R51 Headache: Secondary | ICD-10-CM

## 2017-05-16 DIAGNOSIS — J018 Other acute sinusitis: Secondary | ICD-10-CM | POA: Diagnosis not present

## 2017-05-16 DIAGNOSIS — R519 Headache, unspecified: Secondary | ICD-10-CM

## 2017-05-16 MED ORDER — FEXOFENADINE-PSEUDOEPHED ER 180-240 MG PO TB24: 1.0000 | ORAL_TABLET | Freq: Every day | ORAL | 0 refills | Status: DC

## 2017-05-16 MED ORDER — FLUTICASONE PROPIONATE 50 MCG/ACT NA SUSP: 2.0000 | Freq: Every day | NASAL | 0 refills | Status: DC

## 2017-05-16 MED ORDER — CEFUROXIME AXETIL 500 MG PO TABS: 500.0000 mg | ORAL_TABLET | Freq: Two times a day (BID) | ORAL | 0 refills | Status: DC

## 2017-05-16 NOTE — ED Triage Notes (Signed)
Patient c/o nasal congestion, HAs, right ear, sinus pressure that started a week ago.

## 2017-05-16 NOTE — ED Provider Notes (Signed)
[facility]    CSN: [id number] Arrival date & time: 05/16/17  1001     History   Chief Complaint Chief Complaint  Patient presents with  . Allergies  . Headache    HPI Cindy Fisher is a 56 y.o. female.   Patient with history of headache nasal congestion she states that the headache and nasal congestion similar to her allergies that she has. Everything started Sunday is progressively gotten worse she is having trouble breathing out of the nostril she feels miserable. No change in his sputum but not limited blowing anything out placenta is stopped up. States was this happens she usually requires antibiotic. She is allergic to penicillin sulfa codeine and Vicodin and Percocet. She states that she doesn't like prednisone and she never smoked. Strong family history hypertension.   The history is provided by the patient. No language interpreter was used.  Headache  Pain location:  Frontal Quality:  Sharp Onset quality:  Sudden Duration:  4 days Timing:  Constant Progression:  Worsening Chronicity:  New Similar to prior headaches: yes   Relieved by:  Nothing Worsened by:  Activity Ineffective treatments:  None tried Associated symptoms: congestion, dizziness, drainage, facial pain and sinus pressure     Past Medical History:  Diagnosis Date  . Asthma   . Heart murmur   . Hypertension     Patient Active Problem List   Diagnosis Date Noted  . Chest pain 11/05/2015  . Hypertension 11/05/2015  . Asthma 11/05/2015    Past Surgical History:  Procedure Laterality Date  . ABDOMINAL HYSTERECTOMY    . FOOT SURGERY Left   . HAND SURGERY Right     OB History    No data available       Home Medications    Prior to Admission medications   Medication Sig Start Date End Date Taking? Authorizing Provider  acetaminophen (TYLENOL) 500 MG tablet Take 1,000 mg by mouth every 6 (six) hours as needed for mild pain or headache.    [provider]    albuterol (PROVENTIL HFA;VENTOLIN HFA) 108 (90 Base) MCG/ACT inhaler Inhale 2 puffs into the lungs every 4 (four) hours as needed for wheezing or shortness of breath. 11/05/15   Katha Hamming, MD  amLODipine (NORVASC) 5 MG tablet Take 5 mg by mouth daily.    [provider]  benzonatate (TESSALON PERLES) 100 MG capsule Take 1 capsule (100 mg total) by mouth 3 (three) times daily as needed for cough. 11/04/16   Renford Dills, NP  cefUROXime (CEFTIN) 500 MG tablet Take 1 tablet (500 mg total) by mouth 2 (two) times daily. 05/16/17   Hassan Rowan, MD  doxycycline (VIBRAMYCIN) 100 MG capsule Take 1 capsule (100 mg total) by mouth 2 (two) times daily. 11/04/16   Renford Dills, NP  fexofenadine-pseudoephedrine (ALLEGRA-D ALLERGY & CONGESTION) 180-240 MG 24 hr tablet Take 1 tablet by mouth daily. 05/16/17   Hassan Rowan, MD  fluticasone (FLONASE) 50 MCG/ACT nasal spray Place 1 spray into both nostrils daily as needed for rhinitis.     [provider]  fluticasone (FLONASE) 50 MCG/ACT nasal spray Place 2 sprays into both nostrils daily. 05/16/17   Hassan Rowan, MD  ibuprofen (MOTRIN IB) 200 MG tablet Take 1 tablet (200 mg total) by mouth every 6 (six) hours as needed. 11/05/15   Katha Hamming, MD  ofloxacin (FLOXIN OTIC) 0.3 % otic solution Place 5 drops into the right ear daily. 11/05/15   Katha Hamming, MD  orphenadrine (NORFLEX) 100 MG tablet Take 1 tablet (100 mg total) by mouth 2 (two) times daily. 04/25/16   Defelice, Para March, NP    Family History History reviewed. No pertinent family history.  Social History Social History  Substance Use Topics  . Smoking status: Never Smoker  . Smokeless tobacco: Never Used  . Alcohol use No     Allergies   Codeine; Penicillins; Percocet [oxycodone-acetaminophen]; Sulfa antibiotics; and Vicodin [hydrocodone-acetaminophen]   Review of Systems Review of Systems  HENT: Positive for congestion, postnasal drip, sinus pain  and sinus pressure.   Neurological: Positive for dizziness and headaches.  All other systems reviewed and are negative.    Physical Exam Triage Vital Signs ED Triage Vitals  Enc Vitals Group     BP 05/16/17 1013 134/85     Pulse Rate 05/16/17 1013 64     Resp 05/16/17 1013 16     Temp 05/16/17 1013 98 F (36.7 C)     Temp Source 05/16/17 1013 Oral     SpO2 05/16/17 1013 100 %     Weight 05/16/17 1011 135 lb (61.2 kg)     Height 05/16/17 1011 5\' 4"  (1.626 m)     Head Circumference --      Peak Flow --      Pain Score 05/16/17 1011 10     Pain Loc --      Pain Edu? --      Excl. in GC? --    No data found.   Updated Vital Signs BP 134/85 (BP Location: Left Arm)   Pulse 64   Temp 98 F (36.7 C) (Oral)   Resp 16   Ht 5\' 4"  (1.626 m)   Wt 135 lb (61.2 kg)   SpO2 100%   BMI 23.17 kg/m   Visual Acuity Right Eye Distance:   Left Eye Distance:   Bilateral Distance:    Right Eye Near:   Left Eye Near:    Bilateral Near:     Physical Exam  Constitutional: She is oriented to person, place, and time. She appears well-developed and well-nourished.  HENT:  Head: Normocephalic and atraumatic.  Right Ear: External ear normal.  Left Ear: External ear normal.  Mouth/Throat: Oropharynx is clear and moist.  Eyes: Pupils are equal, round, and reactive to light. EOM are normal.  Neck: Normal range of motion. Neck supple.  Cardiovascular: Normal rate and regular rhythm.   Pulmonary/Chest: Effort normal and breath sounds normal.  Musculoskeletal: Normal range of motion.  Neurological: She is alert and oriented to person, place, and time.  Skin: Skin is warm.  Psychiatric: She has a normal mood and affect.  Vitals reviewed.    UC Treatments / Results  Labs (all labs ordered are listed, but only abnormal results are displayed) Labs Reviewed - No data to display  EKG  EKG Interpretation None       Radiology No results found.  Procedures Procedures (including  critical care time)  Medications Ordered in UC Medications - No data to display   Initial Impression / Assessment and Plan / UC Course  I have reviewed the triage vital signs and the nursing notes.  Pertinent labs & imaging results that were available during my care of the patient were reviewed by me and considered in my medical decision making (see chart for details).    Patient did not want to be on oral prednisone will place on Flonase nasal spray 2 puffs each nostril Ceftin 500 mg  1 tablet twice a day for 10 days and Allegra-D 24 hours 1 tablet day follow-up PCP as needed to 3 weeks work note given for today and tomorrow as well.  Final Clinical Impressions(s) / UC Diagnoses   Final diagnoses:  Sinus headache  Other acute sinusitis, recurrence not specified  Seasonal allergic rhinitis due to pollen    New Prescriptions New Prescriptions   CEFUROXIME (CEFTIN) 500 MG TABLET    Take 1 tablet (500 mg total) by mouth 2 (two) times daily.   FEXOFENADINE-PSEUDOEPHEDRINE (ALLEGRA-D ALLERGY & CONGESTION) 180-240 MG 24 HR TABLET    Take 1 tablet by mouth daily.   FLUTICASONE (FLONASE) 50 MCG/ACT NASAL SPRAY    Place 2 sprays into both nostrils daily.   Note: This dictation was prepared with Dragon dictation along with smaller phrase technology. Any transcriptional errors that result from this process are unintentional. Controlled Substance Prescriptions [place] Controlled Substance Registry consulted? Not Applicable   Hassan RowanWade, Gianluca Chhim, MD 05/16/17 1102

## 2018-05-27 ENCOUNTER — Encounter: Payer: Self-pay | Admitting: Emergency Medicine

## 2018-05-27 ENCOUNTER — Ambulatory Visit
Admission: EM | Admit: 2018-05-27 | Discharge: 2018-05-27 | Disposition: A | Discharge status: Home / Self Care | Payer: BLUE CROSS/BLUE SHIELD | Attending: Family Medicine | Admitting: Family Medicine

## 2018-05-27 ENCOUNTER — Other Ambulatory Visit: Payer: Self-pay

## 2018-05-27 DIAGNOSIS — I10 Essential (primary) hypertension: Secondary | ICD-10-CM | POA: Diagnosis not present

## 2018-05-27 DIAGNOSIS — J069 Acute upper respiratory infection, unspecified: Secondary | ICD-10-CM | POA: Diagnosis not present

## 2018-05-27 MED ORDER — BENZONATATE 200 MG PO CAPS: ORAL_CAPSULE | ORAL | 0 refills | Status: DC

## 2018-05-27 MED ORDER — FLUTICASONE PROPIONATE 50 MCG/ACT NA SUSP: 2.0000 | Freq: Every day | NASAL | 0 refills | Status: DC

## 2018-05-27 NOTE — ED Provider Notes (Signed)
[facility]    CSN: [id number] Arrival date & time: 05/27/18  9562     History   Chief Complaint Chief Complaint  Patient presents with  . URI  . Hypertension    HPI Cindy Fisher is a 57 y.o. female.   HPI  57 year old female presents with cough congestion and headache  along with lightheadedness on occasion.  States that this started about a week ago.  Seen by her primary care physician but was not prescribed anything.  Feeling lightheaded this morning, her daughter checked her blood pressure with a new cuff he said it was 177/98.  Pressure on admission here was 139/84.  Is been taking over-the-counter medications but has not had much relief from them.  Says she is recently started on a new medication for blood pressure scheduledl to be seen by her primary care physician again on Monday.  She states that she has been out of work for this entire week.         Past Medical History:  Diagnosis Date  . Asthma   . Heart murmur   . Hypertension     Patient Active Problem List   Diagnosis Date Noted  . Chest pain 11/05/2015  . Hypertension 11/05/2015  . Asthma 11/05/2015    Past Surgical History:  Procedure Laterality Date  . ABDOMINAL HYSTERECTOMY    . FOOT SURGERY Left   . HAND SURGERY Right     OB History   None      Home Medications    Prior to Admission medications   Medication Sig Start Date End Date Taking? Authorizing Provider  acetaminophen (TYLENOL) 500 MG tablet Take 1,000 mg by mouth every 6 (six) hours as needed for mild pain or headache.   Yes [provider]  albuterol (PROVENTIL HFA;VENTOLIN HFA) 108 (90 Base) MCG/ACT inhaler Inhale 2 puffs into the lungs every 4 (four) hours as needed for wheezing or shortness of breath. 11/05/15  Yes Katha Hamming, MD  amLODipine (NORVASC) 5 MG tablet Take 5 mg by mouth daily.   Yes [provider]  benzonatate (TESSALON) 200 MG capsule Take one cap TID PRN cough 05/27/18    Lutricia Feil, PA-C  fluticasone (FLONASE) 50 MCG/ACT nasal spray Place 2 sprays into both nostrils daily. 05/27/18   Lutricia Feil, PA-C    Family History History reviewed. No pertinent family history.  Social History Social History   Tobacco Use  . Smoking status: Never Smoker  . Smokeless tobacco: Never Used  Substance Use Topics  . Alcohol use: No  . Drug use: No     Allergies   Codeine; Penicillins; Percocet [oxycodone-acetaminophen]; Sulfa antibiotics; and Vicodin [hydrocodone-acetaminophen]   Review of Systems Review of Systems  Constitutional: Positive for activity change. Negative for chills, fatigue and fever.  HENT: Positive for congestion and postnasal drip.   Respiratory: Positive for cough.   All other systems reviewed and are negative.    Physical Exam Triage Vital Signs ED Triage Vitals  Enc Vitals Group     BP 05/27/18 0922 139/84     Pulse Rate 05/27/18 0922 (!) 51     Resp 05/27/18 0922 16     Temp 05/27/18 0922 98 F (36.7 C)     Temp Source 05/27/18 0922 Oral     SpO2 05/27/18 0922 100 %     Weight 05/27/18 0920 126 lb (57.2 kg)     Height 05/27/18 0920 5\' 4"  (1.626 m)  Head Circumference --      Peak Flow --      Pain Score 05/27/18 0919 7     Pain Loc --      Pain Edu? --      Excl. in GC? --    No data found.  Updated Vital Signs BP 139/84 (BP Location: Left Arm)   Pulse (!) 51   Temp 98 F (36.7 C) (Oral)   Resp 16   Ht 5\' 4"  (1.626 m)   Wt 126 lb (57.2 kg)   SpO2 100%   BMI 21.63 kg/m   Visual Acuity Right Eye Distance:   Left Eye Distance:   Bilateral Distance:    Right Eye Near:   Left Eye Near:    Bilateral Near:     Physical Exam  Constitutional: She is oriented to person, place, and time. She appears well-developed and well-nourished. No distress.  HENT:  Head: Normocephalic.  Right Ear: External ear normal.  Left Ear: External ear normal.  Nose: Nose normal.  Mouth/Throat: Oropharynx is clear  and moist. No oropharyngeal exudate.  Eyes: Pupils are equal, round, and reactive to light. Right eye exhibits no discharge. Left eye exhibits no discharge.  Neck: Normal range of motion.  Pulmonary/Chest: Effort normal and breath sounds normal.  Musculoskeletal: Normal range of motion.  Lymphadenopathy:    She has no cervical adenopathy.  Neurological: She is alert and oriented to person, place, and time.  Skin: Skin is warm and dry. She is not diaphoretic.  Psychiatric: She has a normal mood and affect. Her behavior is normal. Judgment and thought content normal.  Nursing note and vitals reviewed.    UC Treatments / Results  Labs (all labs ordered are listed, but only abnormal results are displayed) Labs Reviewed - No data to display  EKG None  Radiology No results found.  Procedures Procedures (including critical care time)  Medications Ordered in UC Medications - No data to display  Initial Impression / Assessment and Plan / UC Course  I have reviewed the triage vital signs and the nursing notes.  Pertinent labs & imaging results that were available during my care of the patient were reviewed by me and considered in my medical decision making (see chart for details).      Told The patient this is likely a viral illness does not require antibiotics at this time.  If she continued to have problems or worsens that she may return to our clinic or follow-up with her primary care physician.  For her blood pressure I have asked her to continue monitoring her blood pressures at home.  In addition she should take her blood pressure cuff to the primary care's office calibration for accuracy.  Provide her with Flonase and Tessalon Perles.  I recommend she return to work tomorrow. Final Clinical Impressions(s) / UC Diagnoses   Final diagnoses:  Upper respiratory tract infection, unspecified type  Essential hypertension     Discharge Instructions     Drink plenty of fluids.   Recommend having your blood pressure cuff abraded to your primary care physician's office cuff to assess accuracy.  Follow-up with your primary care physician at your next scheduled visit.   ED Prescriptions    Medication Sig Dispense Auth. Provider   fluticasone (FLONASE) 50 MCG/ACT nasal spray Place 2 sprays into both nostrils daily. 16 g Ovid Curd P, PA-C   benzonatate (TESSALON) 200 MG capsule Take one cap TID PRN cough 30 capsule Ovid Curd  P, PA-C     Controlled Substance Prescriptions [place] Controlled Substance Registry consulted? Not Applicable   Lutricia Feil, PA-C 05/27/18 1123

## 2018-05-27 NOTE — Discharge Instructions (Signed)
Drink plenty of fluids.  Recommend having your blood pressure cuff abraded to your primary care physician's office cuff to assess accuracy.  Follow-up with your primary care physician at your next scheduled visit.

## 2018-05-27 NOTE — ED Triage Notes (Signed)
Pt c/o cough, congestion, headache, light headheadness. Pt reports it started about a week ago She went to her PCP but was not prescribed anything for it.  She was feeling lightheaded this morning and her daughter checked her BP and it was 177/98. She has been taking OTC cold medications.

## 2018-06-18 ENCOUNTER — Encounter: Payer: Self-pay | Admitting: Emergency Medicine

## 2018-06-18 ENCOUNTER — Other Ambulatory Visit: Payer: Self-pay

## 2018-06-18 ENCOUNTER — Ambulatory Visit
Admission: EM | Admit: 2018-06-18 | Discharge: 2018-06-18 | Disposition: A | Discharge status: Home / Self Care | Payer: BLUE CROSS/BLUE SHIELD | Attending: Family Medicine | Admitting: Family Medicine

## 2018-06-18 DIAGNOSIS — R197 Diarrhea, unspecified: Secondary | ICD-10-CM

## 2018-06-18 DIAGNOSIS — I10 Essential (primary) hypertension: Secondary | ICD-10-CM | POA: Diagnosis not present

## 2018-06-18 DIAGNOSIS — J069 Acute upper respiratory infection, unspecified: Secondary | ICD-10-CM

## 2018-06-18 DIAGNOSIS — R1084 Generalized abdominal pain: Secondary | ICD-10-CM

## 2018-06-18 NOTE — Discharge Instructions (Addendum)
Rest. Drink plenty of fluids. Over the counter imodium as needed   Follow up with your primary care physician this week as needed. Return to Urgent care for new or worsening concerns.

## 2018-06-18 NOTE — ED Provider Notes (Signed)
[facility] ____________________________________________  Time seen: Approximately 10:37 AM  I have reviewed the triage vital signs and the nursing notes.   HISTORY  Chief Complaint Diarrhea   HPI Cindy Fisher is a 57 y.o. female presenting for evaluation of abdominal pain and diarrhea.  Patient reports Monday morning she had quick onset of crampy abdominal pain with diarrhea following.  States had diarrhea all day on Monday.  State this also continued yesterday morning again accompanying crampy abdominal discomfort.  No vomiting or more nausea.  Denies abnormal color stool, dark stool or blood in stool.  States last diarrhea was early in the day yesterday.  States has continued with intermittent abdominal pain, stating predominantly around the bellybutton and described as cramping returning pain.  Denies known food trigger.  Reports someone at work recently sick with similar.  Continues to sip on fluids, has not eaten anything today has not much of an appetite.  Denies accompanying fevers.  No over-the-counter medications tried for the same complaints.  Denies other aggravating factors. Reports otherwise doing well. Denies chest pain, shortness of breath, or dysuria. Denies recent sickness. Denies recent antibiotic use.   Jacquenette Shone, NP: PCP    Past Medical History:  Diagnosis Date  . Asthma   . Heart murmur   . Hypertension     Patient Active Problem List   Diagnosis Date Noted  . Chest pain 11/05/2015  . Hypertension 11/05/2015  . Asthma 11/05/2015    Past Surgical History:  Procedure Laterality Date  . ABDOMINAL HYSTERECTOMY    . FOOT SURGERY Left   . HAND SURGERY Right      No current facility-administered medications for this encounter.   Current Outpatient Medications:  .  albuterol (PROVENTIL HFA;VENTOLIN HFA) 108 (90 Base) MCG/ACT inhaler, Inhale 2 puffs into the lungs every 4 (four) hours as needed for wheezing or shortness of breath., Disp:  1 Inhaler, Rfl: 1 .  amLODipine (NORVASC) 5 MG tablet, Take 5 mg by mouth daily., Disp: , Rfl:   Allergies Codeine; Penicillins; Percocet [oxycodone-acetaminophen]; Sulfa antibiotics; and Vicodin [hydrocodone-acetaminophen]  History reviewed. No pertinent family history.  Social History Social History   Tobacco Use  . Smoking status: Never Smoker  . Smokeless tobacco: Never Used  Substance Use Topics  . Alcohol use: No  . Drug use: No    Review of Systems Constitutional: No fever Cardiovascular: Denies chest pain. Respiratory: Denies shortness of breath. Gastrointestinal: As above.  No nausea, vomiting, constipation. Genitourinary: Negative for dysuria. Musculoskeletal: Negative for back pain. Skin: Negative for rash.  ____________________________________________   PHYSICAL EXAM:  VITAL SIGNS: ED Triage Vitals  Enc Vitals Group     BP 06/18/18 0906 (!) 142/76     Pulse Rate 06/18/18 0906 (!) 54     Resp 06/18/18 0906 18     Temp 06/18/18 0906 98.6 F (37 C)     Temp Source 06/18/18 0906 Oral     SpO2 06/18/18 0906 100 %     Weight 06/18/18 0907 126 lb (57.2 kg)     Height 06/18/18 0907 5\' 5"  (1.651 m)     Head Circumference --      Peak Flow --      Pain Score 06/18/18 0906 9     Pain Loc --      Pain Edu? --      Excl. in GC? --     Constitutional: Alert and oriented. Well appearing and in no acute distress. ENT  Head: Normocephalic and atraumatic. Cardiovascular: Normal rate, regular rhythm. Grossly normal heart sounds.  Good peripheral circulation. Respiratory: Normal respiratory effort without tachypnea nor retractions. Breath sounds are clear and equal bilaterally. No wheezes, rales, rhonchi. Gastrointestinal: No distention. Normal Bowel sounds.  Mild diffuse abdominal tenderness.  No point tenderness.  Non-guarding.  Musculoskeletal: No midline cervical, thoracic or lumbar tenderness to palpation.  Moderate right latissimus dorsi tenderness,  reproducible with direct palpation as well as overhead stretching. Neurologic:  Normal speech and language. Speech is normal. No gait instability.  Skin:  Skin is warm, dry and intact. No rash noted. Psychiatric: Mood and affect are normal. Speech and behavior are normal. Patient exhibits appropriate insight and judgment   ___________________________________________   LABS (all labs ordered are listed, but only abnormal results are displayed)  Labs Reviewed - No data to display   PROCEDURES Procedures    INITIAL IMPRESSION / ASSESSMENT AND PLAN / ED COURSE  Pertinent labs & imaging results that were available during my care of the patient were reviewed by me and considered in my medical decision making (see chart for details).  Well-appearing patient.  No acute distress.  Suspect viral gastroenteritis.  Encourage rest, fluids, brat diet.  Work note given for today and tomorrow.  Discussed over-the-counter Imodium if needed as well as discussed return parameters for further evaluation if symptoms persist.Discussed indication, risks and benefits of medications with patient.  Work note given for today and tomorrow.  Discussed follow up with Primary care physician this week as needed. Discussed follow up and return parameters including no resolution or any worsening concerns. Patient verbalized understanding and agreed to plan.   ____________________________________________   FINAL CLINICAL IMPRESSION(S) / ED DIAGNOSES  Final diagnoses:  Diarrhea, unspecified type  Generalized abdominal pain     ED Discharge Orders    None       Note: This dictation was prepared with Dragon dictation along with smaller phrase technology. Any transcriptional errors that result from this process are unintentional.         Renford Dills, NP 06/18/18 1049

## 2018-06-18 NOTE — ED Triage Notes (Signed)
Patient c/o diarrhea x 2 days. Patient states she still has abdominal cramping but last episode of diarrhea was yesterday.  Patient denies vomiting.

## 2020-01-28 ENCOUNTER — Encounter: Payer: Self-pay | Admitting: Ophthalmology

## 2020-01-28 ENCOUNTER — Other Ambulatory Visit: Payer: Self-pay

## 2020-02-03 ENCOUNTER — Other Ambulatory Visit: Payer: Self-pay

## 2020-02-03 ENCOUNTER — Other Ambulatory Visit
Admission: RE | Admit: 2020-02-03 | Discharge: 2020-02-03 | Disposition: A | Discharge status: Home / Self Care | Payer: BLUE CROSS/BLUE SHIELD | Source: Ambulatory Visit | Attending: Ophthalmology | Admitting: Ophthalmology

## 2020-02-03 DIAGNOSIS — Z20822 Contact with and (suspected) exposure to covid-19: Secondary | ICD-10-CM | POA: Diagnosis not present

## 2020-02-03 DIAGNOSIS — Z01812 Encounter for preprocedural laboratory examination: Secondary | ICD-10-CM | POA: Diagnosis not present

## 2020-02-03 NOTE — Discharge Instructions (Signed)
INSTRUCTIONS FOLLOWING OCULOPLASTIC SURGERY AMY M. FOWLER, MD  AFTER YOUR EYE SURGERY, THER ARE MANY THINGS WHICH YOU, THE PATIENT, CAN DO TO ASSURE THE BEST POSSIBLE RESULT FROM YOUR OPERATION.  THIS SHEET SHOULD BE REFERRED TO WHENEVER QUESTIONS ARISE.  IF THERE ARE ANY QUESTIONS NOT ANSWERED HERE, DO NOT HESITATE TO CALL OUR OFFICE AT [phone] OR [phone].  THERE IS ALWAYS SOMEONE AVAILABLE TO CALL IF QUESTIONS OR PROBLEMS ARISE.  VISION: Your vision may be blurred and out of focus after surgery until you are able to stop using your ointment, swelling resolves and your eye(s) heal. This may take 1 to 2 weeks at the least.  If your vision becomes gradually more dim or dark, this is not normal and you need to call our office immediately.  EYE CARE: For the first 48 hours after surgery, use ice packs frequently - "20 minutes on, 20 minutes off" - to help reduce swelling and bruising.  Small bags of frozen peas or corn make good ice packs along with cloths soaked in ice water.  If you are wearing a patch or other type of dressing following surgery, keep this on for the amount of time specified by your doctor.  For the first week following surgery, you will need to treat your stitches with great care.  It is OK to shower, but take care to not allow soapy water to run into your eye(s) to help reduce chances of infection.  You may gently clean the eyelashes and around the eye(s) with cotton balls and sterile water, BUT DO NOT RUB THE STITCHES VIGOROUSLY.  Keeping your stitches moist with ointment will help promote healing with minimal scar formation.  ACTIVITY: When you leave the surgery center, you should go home, rest and be inactive.  The eye(s) may feel scratchy and keeping the eyes closed will allow for faster healing.  The first week following surgery, avoid straining (anything making the face turn red) or lifting over 20 pounds.  Additionally, avoid bending which causes your head to go below  your waist.  Using your eyes will NOT harm them, so feel free to read, watch television, use the computer, etc as desired.  Driving depends on each individual, so check with your doctor if you have questions about driving. Do not wear contact lenses for about 2 weeks.  Do not wear eye makeup for 2 weeks.  Avoid swimming, hot tubs, gardening, and dusting for 1 to 2 weeks to reduce the risk of an infection.  MEDICATIONS:  You will be given a prescription for an ointment to use 4 times a day on your stitches.  You can use the ointment in your eyes if they feel scratchy or irritated.  If you eyelid(s) don't close completely when you sleep, put some ointment in your eyes before bedtime.  EMERGENCY: If you experience SEVERE EYE PAIN OR HEADACHE UNRELIEVED BY TYLENOL OR TRAMADOL, NAUSEA OR VOMITING, WORSENING REDNESS, OR WORSENING VISION (ESPECIALLY VISION THAT WAS INITIALLY BETTER) CALL [phone] OR [phone] DURING BUSINESS HOURS OR AFTER HOURS.  General Anesthesia, Adult, Care After This sheet gives you information about how to care for yourself after your procedure. Your health care provider may also give you more specific instructions. If you have problems or questions, contact your health care provider. What can I expect after the procedure? After the procedure, the following side effects are common:  Pain or discomfort at the IV site.  Nausea.  Vomiting.  Sore throat.  Trouble concentrating.    Feeling cold or chills.  Weak or tired.  Sleepiness and fatigue.  Soreness and body aches. These side effects can affect parts of the body that were not involved in surgery. Follow these instructions at home:  For at least 24 hours after the procedure:  Have a responsible adult stay with you. It is important to have someone help care for you until you are awake and alert.  Rest as needed.  Do not: ? Participate in activities in which you could fall or become injured. ? Drive. ? Use  heavy machinery. ? Drink alcohol. ? Take sleeping pills or medicines that cause drowsiness. ? Make important decisions or sign legal documents. ? Take care of children on your own. Eating and drinking  Follow any instructions from your health care provider about eating or drinking restrictions.  When you feel hungry, start by eating small amounts of foods that are soft and easy to digest (bland), such as toast. Gradually return to your regular diet.  Drink enough fluid to keep your urine pale yellow.  If you vomit, rehydrate by drinking water, juice, or clear broth. General instructions  If you have sleep apnea, surgery and certain medicines can increase your risk for breathing problems. Follow instructions from your health care provider about wearing your sleep device: ? Anytime you are sleeping, including during daytime naps. ? While taking prescription pain medicines, sleeping medicines, or medicines that make you drowsy.  Return to your normal activities as told by your health care provider. Ask your health care provider what activities are safe for you.  Take over-the-counter and prescription medicines only as told by your health care provider.  If you smoke, do not smoke without supervision.  Keep all follow-up visits as told by your health care provider. This is important. Contact a health care provider if:  You have nausea or vomiting that does not get better with medicine.  You cannot eat or drink without vomiting.  You have pain that does not get better with medicine.  You are unable to pass urine.  You develop a skin rash.  You have a fever.  You have redness around your IV site that gets worse. Get help right away if:  You have difficulty breathing.  You have chest pain.  You have blood in your urine or stool, or you vomit blood. Summary  After the procedure, it is common to have a sore throat or nausea. It is also common to feel tired.  Have a  responsible adult stay with you for the first 24 hours after general anesthesia. It is important to have someone help care for you until you are awake and alert.  When you feel hungry, start by eating small amounts of foods that are soft and easy to digest (bland), such as toast. Gradually return to your regular diet.  Drink enough fluid to keep your urine pale yellow.  Return to your normal activities as told by your health care provider. Ask your health care provider what activities are safe for you. This information is not intended to replace advice given to you by your health care provider. Make sure you discuss any questions you have with your health care provider. Document Revised: 09/06/2017 Document Reviewed: 04/19/2017 Elsevier Patient Education  2020 [employer]  

## 2020-02-04 ENCOUNTER — Telehealth: Payer: Self-pay | Admitting: Family Medicine

## 2020-02-04 LAB — SARS CORONAVIRUS 2 (TAT 6-24 HRS): SARS Coronavirus 2: NEGATIVE

## 2020-02-04 NOTE — Telephone Encounter (Signed)
Negative COVID results given. Patient results "NOT Detected." Caller expressed understanding. ° °

## 2020-02-05 ENCOUNTER — Encounter
Admission: RE | Disposition: A | Discharge status: Home / Self Care | Payer: Self-pay | Source: Home / Self Care | Attending: Ophthalmology

## 2020-02-05 ENCOUNTER — Ambulatory Visit: Payer: BLUE CROSS/BLUE SHIELD | Admitting: Anesthesiology

## 2020-02-05 ENCOUNTER — Encounter: Payer: Self-pay | Admitting: Ophthalmology

## 2020-02-05 ENCOUNTER — Ambulatory Visit
Admission: RE | Admit: 2020-02-05 | Discharge: 2020-02-05 | Disposition: A | Discharge status: Home / Self Care | Payer: BLUE CROSS/BLUE SHIELD | Attending: Ophthalmology | Admitting: Ophthalmology

## 2020-02-05 ENCOUNTER — Other Ambulatory Visit: Payer: Self-pay

## 2020-02-05 DIAGNOSIS — H02834 Dermatochalasis of left upper eyelid: Secondary | ICD-10-CM | POA: Diagnosis not present

## 2020-02-05 DIAGNOSIS — Z882 Allergy status to sulfonamides status: Secondary | ICD-10-CM | POA: Diagnosis not present

## 2020-02-05 DIAGNOSIS — Z886 Allergy status to analgesic agent status: Secondary | ICD-10-CM | POA: Insufficient documentation

## 2020-02-05 DIAGNOSIS — J45909 Unspecified asthma, uncomplicated: Secondary | ICD-10-CM | POA: Diagnosis not present

## 2020-02-05 DIAGNOSIS — Z885 Allergy status to narcotic agent status: Secondary | ICD-10-CM | POA: Diagnosis not present

## 2020-02-05 DIAGNOSIS — Z79899 Other long term (current) drug therapy: Secondary | ICD-10-CM | POA: Diagnosis not present

## 2020-02-05 DIAGNOSIS — M1990 Unspecified osteoarthritis, unspecified site: Secondary | ICD-10-CM | POA: Insufficient documentation

## 2020-02-05 DIAGNOSIS — Z880 Allergy status to penicillin: Secondary | ICD-10-CM | POA: Insufficient documentation

## 2020-02-05 DIAGNOSIS — I10 Essential (primary) hypertension: Secondary | ICD-10-CM | POA: Diagnosis not present

## 2020-02-05 DIAGNOSIS — H02831 Dermatochalasis of right upper eyelid: Secondary | ICD-10-CM | POA: Diagnosis not present

## 2020-02-05 DIAGNOSIS — H02403 Unspecified ptosis of bilateral eyelids: Secondary | ICD-10-CM | POA: Insufficient documentation

## 2020-02-05 HISTORY — DX: Unspecified osteoarthritis, unspecified site: M19.90

## 2020-02-05 HISTORY — PX: BROW LIFT: SHX178

## 2020-02-05 SURGERY — BLEPHAROPLASTY
Anesthesia: Monitor Anesthesia Care | Site: Eye | Laterality: Bilateral

## 2020-02-05 MED ORDER — ALFENTANIL 500 MCG/ML IJ INJ
INJECTION | INTRAVENOUS | Status: DC | PRN
  Administered 2020-02-05: 200 ug via INTRAVENOUS
  Administered 2020-02-05 (×2): 400 ug via INTRAVENOUS

## 2020-02-05 MED ORDER — PROPOFOL 500 MG/50ML IV EMUL
INTRAVENOUS | Status: DC | PRN
  Administered 2020-02-05: 75 ug/kg/min via INTRAVENOUS
  Administered 2020-02-05: 25 ug/kg/min via INTRAVENOUS

## 2020-02-05 MED ORDER — TETRACAINE HCL 0.5 % OP SOLN
OPHTHALMIC | Status: DC | PRN
  Administered 2020-02-05: 2 [drp] via OPHTHALMIC

## 2020-02-05 MED ORDER — MIDAZOLAM HCL 2 MG/2ML IJ SOLN
INTRAMUSCULAR | Status: DC | PRN
  Administered 2020-02-05 (×2): 1 mg via INTRAVENOUS

## 2020-02-05 MED ORDER — ERYTHROMYCIN 5 MG/GM OP OINT
TOPICAL_OINTMENT | OPHTHALMIC | Status: DC | PRN
  Administered 2020-02-05: 1 via OPHTHALMIC

## 2020-02-05 MED ORDER — TRAMADOL HCL 50 MG PO TABS: ORAL_TABLET | ORAL | 0 refills | Status: DC

## 2020-02-05 MED ORDER — BSS IO SOLN
INTRAOCULAR | Status: DC | PRN
  Administered 2020-02-05: 15 mL

## 2020-02-05 MED ORDER — LIDOCAINE-EPINEPHRINE 2 %-1:100000 IJ SOLN
INTRAMUSCULAR | Status: DC | PRN
  Administered 2020-02-05: 2 mL via OPHTHALMIC
  Administered 2020-02-05: 1.5 mL via OPHTHALMIC

## 2020-02-05 MED ORDER — ERYTHROMYCIN 5 MG/GM OP OINT
TOPICAL_OINTMENT | OPHTHALMIC | 2 refills | Status: DC
Start: 2020-02-05 — End: 2020-10-26

## 2020-02-05 MED ORDER — LACTATED RINGERS IV SOLN: INTRAVENOUS | Status: DC

## 2020-02-05 SURGICAL SUPPLY — 22 items
APPLICATOR COTTON TIP WD 3 STR (MISCELLANEOUS) ×3 IMPLANT
BLADE SURG 15 STRL LF DISP TIS (BLADE) ×1 IMPLANT
BLADE SURG 15 STRL SS (BLADE) ×2
CORD BIP STRL DISP 12FT (MISCELLANEOUS) ×3 IMPLANT
GAUZE SPONGE 4X4 12PLY STRL (GAUZE/BANDAGES/DRESSINGS) ×3 IMPLANT
GLOVE BIO SURGEON STRL SZ7.5 (GLOVE) ×3 IMPLANT
GLOVE SURG LX 7.0 MICRO (GLOVE) ×4
GLOVE SURG LX STRL 7.0 MICRO (GLOVE) ×2 IMPLANT
MARKER SKIN XFINE TIP W/RULER (MISCELLANEOUS) ×3 IMPLANT
NEEDLE FILTER BLUNT 18X 1/2SAF (NEEDLE) ×2
NEEDLE FILTER BLUNT 18X1 1/2 (NEEDLE) ×1 IMPLANT
NEEDLE HYPO 30X.5 LL (NEEDLE) ×6 IMPLANT
PACK ENT CUSTOM (PACKS) ×3 IMPLANT
SOL PREP PVP 2OZ (MISCELLANEOUS) ×3
SOLUTION PREP PVP 2OZ (MISCELLANEOUS) ×1 IMPLANT
SPONGE GAUZE 2X2 8PLY STER LF (GAUZE/BANDAGES/DRESSINGS) ×10
SPONGE GAUZE 2X2 8PLY STRL LF (GAUZE/BANDAGES/DRESSINGS) ×20 IMPLANT
SUT GUT PLAIN 6-0 1X18 ABS (SUTURE) ×3 IMPLANT
SUT PROLENE 6 0 P 1 18 (SUTURE) ×6 IMPLANT
SYR 10ML LL (SYRINGE) ×3 IMPLANT
SYR 3ML LL SCALE MARK (SYRINGE) ×3 IMPLANT
WATER STERILE IRR 250ML POUR (IV SOLUTION) ×3 IMPLANT

## 2020-02-05 NOTE — Interval H&P Note (Signed)
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  The various methods of treatment have been discussed with the patient and family. After consideration of risks, benefits and other options for treatment, the patient has consented to  Procedure(s): BLEPHAROPLASTY UPPER EYELID; W/EXCESS SKIN BLEPHAROPTOSIS REPAIR; RESECT EX (Bilateral) as a surgical intervention.  The patient's history has been reviewed, patient examined, no change in status, stable for surgery.  I have reviewed the patient's chart and labs.  Questions were answered to the patient's satisfaction.     Ether Griffins, Rease Swinson M

## 2020-02-05 NOTE — Anesthesia Procedure Notes (Signed)
Date/Time: 02/05/2020 12:58 PM Performed by: Maree Krabbe, CRNA Pre-anesthesia Checklist: Patient identified, Emergency Drugs available, Suction available, Timeout performed and Patient being monitored Patient Re-evaluated:Patient Re-evaluated prior to induction Oxygen Delivery Method: Nasal cannula Placement Confirmation: positive ETCO2

## 2020-02-05 NOTE — H&P (Signed)
See the history and physical completed at [facility] on 01/18/20 and scanned into the chart.   

## 2020-02-05 NOTE — Transfer of Care (Signed)
Immediate Anesthesia Transfer of Care Note  Patient: Cindy Fisher  Procedure(s) Performed: BLEPHAROPLASTY UPPER EYELID; W/EXCESS SKIN BLEPHAROPTOSIS REPAIR; RESECT EX (Bilateral Eye)  Patient Location: PACU  Anesthesia Type: MAC  Level of Consciousness: awake, alert  and patient cooperative  Airway and Oxygen Therapy: Patient Spontanous Breathing and Patient connected to supplemental oxygen  Post-op Assessment: Post-op Vital signs reviewed, Patient's Cardiovascular Status Stable, Respiratory Function Stable, Patent Airway and No signs of Nausea or vomiting  Post-op Vital Signs: Reviewed and stable  Complications: No apparent anesthesia complications

## 2020-02-05 NOTE — Anesthesia Preprocedure Evaluation (Signed)
Anesthesia Evaluation  Patient identified by MRN, date of birth, ID band Patient awake    Reviewed: NPO status   History of Anesthesia Complications Negative for: history of anesthetic complications  Airway Mallampati: II  TM Distance: >3 FB Neck ROM: full    Dental  (+) Missing, Chipped,    Pulmonary asthma (mild) ,    Pulmonary exam normal        Cardiovascular Exercise Tolerance: Good hypertension, Normal cardiovascular exam+ Valvular Problems/Murmurs (benign murmur)      Neuro/Psych negative neurological ROS  negative psych ROS   GI/Hepatic negative GI ROS, Neg liver ROS,   Endo/Other  negative endocrine ROS  Renal/GU negative Renal ROS  negative genitourinary   Musculoskeletal  (+) Arthritis ,   Abdominal   Peds  Hematology negative hematology ROS (+)   Anesthesia Other Findings covid: NEG.  Reproductive/Obstetrics                             Anesthesia Physical Anesthesia Plan  ASA: II  Anesthesia Plan: MAC   Post-op Pain Management:    Induction:   PONV Risk Score and Plan: 2 and TIVA and Propofol infusion  Airway Management Planned:   Additional Equipment:   Intra-op Plan:   Post-operative Plan:   Informed Consent: I have reviewed the patients History and Physical, chart, labs and discussed the procedure including the risks, benefits and alternatives for the proposed anesthesia with the patient or authorized representative who has indicated his/her understanding and acceptance.       Plan Discussed with: CRNA  Anesthesia Plan Comments:         Anesthesia Quick Evaluation

## 2020-02-05 NOTE — Anesthesia Postprocedure Evaluation (Signed)
Anesthesia Post Note  Patient: Cindy Fisher  Procedure(s) Performed: BLEPHAROPLASTY UPPER EYELID; W/EXCESS SKIN BLEPHAROPTOSIS REPAIR; RESECT EX (Bilateral Eye)     Patient location during evaluation: PACU Anesthesia Type: MAC Level of consciousness: awake and alert Pain management: pain level controlled Vital Signs Assessment: post-procedure vital signs reviewed and stable Respiratory status: spontaneous breathing, nonlabored ventilation, respiratory function stable and patient connected to nasal cannula oxygen Cardiovascular status: stable and blood pressure returned to baseline Postop Assessment: no apparent nausea or vomiting Anesthetic complications: no    Alleyne Lac

## 2020-02-05 NOTE — Op Note (Signed)
Preoperative Diagnosis:  1. Visually significant blepharoptosis bilateral  Upper Eyelid(s) 2. Visually significant dermatochalasis bilateral  Upper Eyelid(s)  Postoperative Diagnosis:  Same.  Procedure(s) Performed:   1. Blepharoptosis repair with levator aponeurosis advancement bilateral  Upper Eyelid(s) 2. Upper eyelid blepharoplasty with excess skin excision  bilateral  Upper Eyelid(s)  Surgeon: Philis Pique. Vickki Muff, M.D.  Assistants: none  Anesthesia: MAC  Specimens: None.  Estimated Blood Loss: Minimal.  Complications: None.  Operative Findings: None Dictated  Procedure:   Allergies were reviewed and the patient Codeine, Lactose intolerance (gi), Peanut butter flavor, Penicillins, Percocet [oxycodone-acetaminophen], Shrimp [shellfish allergy], Sulfa antibiotics, and Vicodin [hydrocodone-acetaminophen].   After the risks, benefits, complications and alternatives were discussed with the patient, appropriate informed consent was obtained.  While seated in an upright position and looking in primary gaze, the mid pupillary line was marked on the upper eyelid margins bilaterally. The patient was then brought to the operating suite and reclined supine.  Timeout was conducted and the patient was sedated.  Local anesthetic consisting of a 50-50 mixture of 2% lidocaine with epinephrine and 0.75% bupivacaine with added Hylenex was injected subcutaneously to bilateral  upper eyelid(s). After adequate local was instilled, the patient was prepped and draped in the usual sterile fashion for eyelid surgery.   Attention was turned to the upper eyelids. A 73m upper eyelid crease incision line was marked with calipers on both  upper eyelid(s).  A pinch test was used to estimate the amount of excess skin to remove and this was marked in standard blepharoplasty style fashion. Attention was turned to the  right  upper eyelid. A #15 blade was used to open the premarked incision line. A Skin and muscle flap  was excised and hemostasis was obtained with bipolar cautery.   Westcott scissors were then used to transect through orbicularis down to the tarsal plate. Epitarsus was dissected to create a smooth surface to suture to. Dissection was then carried superiorly in the plane between orbicularis and orbital septum. Once the preaponeurotic fat pocket was identified, the orbital septum was opened. This revealed the levator and its aponeurosis.    Attention was then turned to the opposite eyelid where the same procedure was performed in the same manner. Hemostasis was obtained with bipolar cautery throughout.   3 interrupted 6-0 Prolene sutures were then passed partial thickness through the tarsal plates of both  upper eyelid(s). These sutures were placed in line with the mid pupillary, medial limbal, and lateral limbal lines. The sutures were fixed to the levator aponeurosis and adjusted until a nice lid height and contour were achieved. Once nice symmetry was achieved, the skin incisions were closed with a running 6-0 plain gut suture. The patient tolerated the procedure well.  Erythromycin ophthalmic ophthalmic ointment was applied to the incision site(s) followed by ice packs. The patient was taken to the recovery area where she recovered without difficulty.  Post-Op Plan/Instructions:   The patient was instructed to use ice packs frequently for the next 48 hours. She was instructed to use Erythromycin ophthalmic ophthalmic ointment on her incisions 4 times a day for the next 12 to 14 days. Shewas given a prescription for tramadol (or similar) for pain control should Tylenol not be effective. [facility] or sooner as needed for problems.  Ramone Gander M. FVickki Muff M.D. Ophthalmology

## 2020-02-08 ENCOUNTER — Encounter: Payer: Self-pay | Admitting: *Deleted

## 2020-07-01 ENCOUNTER — Encounter: Payer: Self-pay | Admitting: Emergency Medicine

## 2020-07-01 ENCOUNTER — Ambulatory Visit
Admission: EM | Admit: 2020-07-01 | Discharge: 2020-07-01 | Disposition: A | Discharge status: Home / Self Care | Payer: BLUE CROSS/BLUE SHIELD | Attending: Emergency Medicine | Admitting: Emergency Medicine

## 2020-07-01 ENCOUNTER — Other Ambulatory Visit: Payer: Self-pay

## 2020-07-01 DIAGNOSIS — K5900 Constipation, unspecified: Secondary | ICD-10-CM | POA: Insufficient documentation

## 2020-07-01 DIAGNOSIS — R1084 Generalized abdominal pain: Secondary | ICD-10-CM | POA: Insufficient documentation

## 2020-07-01 LAB — URINALYSIS, COMPLETE (UACMP) WITH MICROSCOPIC
Bilirubin Urine: NEGATIVE
Glucose, UA: NEGATIVE mg/dL
Hgb urine dipstick: NEGATIVE
Ketones, ur: NEGATIVE mg/dL
Leukocytes,Ua: NEGATIVE
Nitrite: NEGATIVE
Protein, ur: NEGATIVE mg/dL
Specific Gravity, Urine: 1.02 (ref 1.005–1.030)
pH: 5.5 (ref 5.0–8.0)

## 2020-07-01 NOTE — ED Provider Notes (Signed)
[facility]    CSN: [id number] Arrival date & time: 07/01/20  1545      History   Chief Complaint Chief Complaint  Patient presents with   Abdominal Pain    HPI Cindy Fisher is a 59 y.o. female.   Cindy Fisher presents with complaints of abdominal pain and bloating since last night. Feel similar to "ibs flare" she has had in the past. She has only been able to pass small stools, small amount today prior to arrival. No nausea or vomiting. Hasn't eaten today due to pain. Has been evaluated for this in the past, is to start pelvic floor P/T due to pelvic floor dysfunction and dyssynergistic defacation. No fevers. Doesn't take any medications for her symptoms. Also has had increased urination. No pain with urination.     ROS per HPI, negative if not otherwise mentioned.      Past Medical History:  Diagnosis Date   Arthritis    Asthma    Heart murmur    Hypertension     Patient Active Problem List   Diagnosis Date Noted   Chest pain 11/05/2015   Hypertension 11/05/2015   Asthma 11/05/2015    Past Surgical History:  Procedure Laterality Date   ABDOMINAL HYSTERECTOMY     BROW LIFT Bilateral 02/05/2020   Procedure: BLEPHAROPLASTY UPPER EYELID; W/EXCESS SKIN BLEPHAROPTOSIS REPAIR; RESECT EX;  Surgeon: Imagene Riches, MD;  Location: [facility];  Service: Ophthalmology;  Laterality: Bilateral;   FOOT SURGERY Left    HAND SURGERY Right     OB History   No obstetric history on file.      Home Medications    Prior to Admission medications   Medication Sig Start Date End Date Taking? Authorizing Provider  albuterol (PROVENTIL HFA;VENTOLIN HFA) 108 (90 Base) MCG/ACT inhaler Inhale 2 puffs into the lungs every 4 (four) hours as needed for wheezing or shortness of breath. 11/05/15  Yes Katha Hamming, MD  amLODipine (NORVASC) 5 MG tablet Take 5 mg by mouth daily.   Yes [provider]  fluticasone (FLOVENT HFA) 110  MCG/ACT inhaler Inhale into the lungs 2 (two) times daily.   Yes [provider]  erythromycin ophthalmic ointment Apply to sutures 4 times a day for 10-12 days.  Discontinue if allergy develops and call our office 02/05/20   Imagene Riches, MD  fluticasone [facility]) 50 MCG/ACT nasal spray Place into both nostrils daily.    [provider]  loratadine (CLARITIN) 10 MG tablet Take 10 mg by mouth daily as needed for allergies.    [provider]  traMADol Janean Sark) 50 MG tablet Take 1 every 4-6 hours as needed for pain not controlled by Tylenol 02/05/20   Imagene Riches, MD    Family History History reviewed. No pertinent family history.  Social History Social History   Tobacco Use   Smoking status: Never Smoker   Smokeless tobacco: Never Used  Building services engineer Use: Never used  Substance Use Topics   Alcohol use: No   Drug use: No     Allergies   Codeine, Lactose intolerance (gi), Peanut butter flavor, Penicillins, Percocet [oxycodone-acetaminophen], Shrimp [shellfish allergy], Sulfa antibiotics, and Vicodin [hydrocodone-acetaminophen]   Review of Systems Review of Systems   Physical Exam Triage Vital Signs ED Triage Vitals  Enc Vitals Group     BP 07/01/20 1618 (!) 141/80     Pulse Rate 07/01/20 1618 62     Resp 07/01/20  1618 14     Temp 07/01/20 1618 98.8 F (37.1 C)     Temp Source 07/01/20 1618 Oral     SpO2 07/01/20 1618 99 %     Weight 07/01/20 1616 130 lb (59 kg)     Height 07/01/20 1616 5\' 4"  (1.626 m)     Head Circumference --      Peak Flow --      Pain Score 07/01/20 1615 10     Pain Loc --      Pain Edu? --      Excl. in GC? --    No data found.  Updated Vital Signs BP (!) 141/80 (BP Location: Left Arm)    Pulse 62    Temp 98.8 F (37.1 C) (Oral)    Resp 14    Ht 5\' 4"  (1.626 m)    Wt 130 lb (59 kg)    SpO2 99%    BMI 22.31 kg/m   Visual Acuity Right Eye Distance:   Left Eye Distance:   Bilateral Distance:     Right Eye Near:   Left Eye Near:    Bilateral Near:     Physical Exam Constitutional:      General: She is not in acute distress.    Appearance: She is well-developed.  Cardiovascular:     Rate and Rhythm: Normal rate.  Pulmonary:     Effort: Pulmonary effort is normal.  Abdominal:     Tenderness: There is generalized abdominal tenderness and tenderness in the periumbilical area and left lower quadrant.  Skin:    General: Skin is warm and dry.  Neurological:     Mental Status: She is alert and oriented to person, place, and time.      UC Treatments / Results  Labs (all labs ordered are listed, but only abnormal results are displayed) Labs Reviewed  URINALYSIS, COMPLETE (UACMP) WITH MICROSCOPIC - Abnormal; Notable for the following components:      Result Value   Bacteria, UA FEW (*)    All other components within normal limits  URINE CULTURE    EKG   Radiology No results found.  Procedures Procedures (including critical care time)  Medications Ordered in UC Medications - No data to display  Initial Impression / Assessment and Plan / UC Course  I have reviewed the triage vital signs and the nursing notes.  Pertinent labs & imaging results that were available during my care of the patient were reviewed by me and considered in my medical decision making (see chart for details).     No red flag findings concerning for surgical abdomen or obstruction. Generalized abdominal pain which is worse to LLQ. Constipation. Treatment plan discussed and follow up recommendations provided. Return precautions provided. Patient verbalized understanding and agreeable to plan.   Final Clinical Impressions(s) / UC Diagnoses   Final diagnoses:  Constipation, unspecified constipation type  Generalized abdominal pain     Discharge Instructions     Please start metamucil or benefiber supplementation to promote regular bowel movements.  Increase the fiber in your diet and  increase your water intake.  Continue to follow up with your gastroenterologist as needed for persistent symptoms.  If worsening of pain, fevers, or otherwise worsening please return.     ED Prescriptions    None     PDMP not reviewed this encounter.   07/03/20, NP 07/01/20 1655

## 2020-07-01 NOTE — ED Triage Notes (Signed)
Patient c/o mid abdominal pain that started last night.  Patient reports hard small stool yesterday.  Patient states that she has IBS.  Patient states that she follows up with her PCP next month. Patient denies fevers.

## 2020-07-01 NOTE — Discharge Instructions (Addendum)
Please start metamucil or benefiber supplementation to promote regular bowel movements.  Increase the fiber in your diet and increase your water intake.  Continue to follow up with your gastroenterologist as needed for persistent symptoms.  If worsening of pain, fevers, or otherwise worsening please return.

## 2020-07-03 LAB — URINE CULTURE: Culture: <10000 — AB

## 2020-09-21 ENCOUNTER — Other Ambulatory Visit: Payer: Self-pay

## 2020-09-21 ENCOUNTER — Encounter: Payer: Self-pay | Admitting: Emergency Medicine

## 2020-09-21 ENCOUNTER — Ambulatory Visit
Admission: EM | Admit: 2020-09-21 | Discharge: 2020-09-21 | Disposition: A | Discharge status: Home / Self Care | Payer: BLUE CROSS/BLUE SHIELD | Attending: Sports Medicine | Admitting: Sports Medicine

## 2020-09-21 DIAGNOSIS — U071 COVID-19: Secondary | ICD-10-CM | POA: Insufficient documentation

## 2020-09-21 DIAGNOSIS — J069 Acute upper respiratory infection, unspecified: Secondary | ICD-10-CM | POA: Diagnosis not present

## 2020-09-21 MED ORDER — BENZONATATE 100 MG PO CAPS: 200.0000 mg | ORAL_CAPSULE | Freq: Three times a day (TID) | ORAL | 0 refills | Status: DC

## 2020-09-21 MED ORDER — PROMETHAZINE-DM 6.25-15 MG/5ML PO SYRP: 5.0000 mL | ORAL_SOLUTION | Freq: Four times a day (QID) | ORAL | 0 refills | Status: DC | PRN

## 2020-09-21 NOTE — Discharge Instructions (Addendum)
Isolate at home until the results of your Covid swab are back.  If they are positive you will need to quarantine for 10 days from when your symptoms started.  After the 10 days you can break quarantine if your symptoms have improved and you have not had a fever for 24 hours without taking Tylenol or ibuprofen.  Use Tylenol and ibuprofen as needed for body aches and fever.  Use your albuterol inhaler as needed for shortness of breath and wheezing.  Use the Tessalon Perles during the day for cough and the Promethazine DM at nighttime for cough, congestion, and sleep.  The cough syrup will make you drowsy.  If you develop worsening shortness of breath, especially at rest, you are not able to speak in full sentences, or you develop bluing of your lips you need to go to the ER for evaluation.

## 2020-09-21 NOTE — ED Provider Notes (Signed)
[facility]    CSN: [id number] Arrival date & time: 09/21/20  1404      History   Chief Complaint Chief Complaint  Patient presents with  . Cough  . Ear Pain  . Headache    HPI Cindy Fisher is a 60 y.o. female.   HPI   60 year old female here for evaluation of headache, bilateral ear pain, and cough.  Patient reports that her symptoms started 3 days ago.  She is also had associated runny nose, sore throat, shortness of breath and wheezing but she also has asthma, body aches, and changes to her sense of taste and smell.  Patient's cough is nonproductive.  Patient denies fever, GI complaints, or sick contacts.  Past Medical History:  Diagnosis Date  . Arthritis   . Asthma   . Heart murmur   . Hypertension     Patient Active Problem List   Diagnosis Date Noted  . Chest pain 11/05/2015  . Hypertension 11/05/2015  . Asthma 11/05/2015    Past Surgical History:  Procedure Laterality Date  . ABDOMINAL HYSTERECTOMY    . BROW LIFT Bilateral 02/05/2020   Procedure: BLEPHAROPLASTY UPPER EYELID; W/EXCESS SKIN BLEPHAROPTOSIS REPAIR; RESECT EX;  Surgeon: Imagene Riches, MD;  Location: [facility];  Service: Ophthalmology;  Laterality: Bilateral;  . FOOT SURGERY Left   . HAND SURGERY Right     OB History   No obstetric history on file.      Home Medications    Prior to Admission medications   Medication Sig Start Date End Date Taking? Authorizing Provider  albuterol (PROVENTIL HFA;VENTOLIN HFA) 108 (90 Base) MCG/ACT inhaler Inhale 2 puffs into the lungs every 4 (four) hours as needed for wheezing or shortness of breath. 11/05/15  Yes Katha Hamming, MD  amLODipine (NORVASC) 5 MG tablet Take 5 mg by mouth daily.   Yes [provider]  benzonatate (TESSALON) 100 MG capsule Take 2 capsules (200 mg total) by mouth every 8 (eight) hours. 09/21/20  Yes Becky Augusta, NP  erythromycin ophthalmic ointment Apply to sutures 4 times a day for 10-12  days.  Discontinue if allergy develops and call our office 02/05/20  Yes Imagene Riches, MD  fluticasone [facility]) 50 MCG/ACT nasal spray Place into both nostrils daily.   Yes [provider]  fluticasone (FLOVENT HFA) 110 MCG/ACT inhaler Inhale into the lungs 2 (two) times daily.   Yes [provider]  loratadine (CLARITIN) 10 MG tablet Take 10 mg by mouth daily as needed for allergies.   Yes [provider]  promethazine-dextromethorphan (PROMETHAZINE-DM) 6.25-15 MG/5ML syrup Take 5 mLs by mouth 4 (four) times daily as needed. 09/21/20  Yes Becky Augusta, NP  traMADol Janean Sark) 50 MG tablet Take 1 every 4-6 hours as needed for pain not controlled by Tylenol 02/05/20  Yes Imagene Riches, MD    Family History History reviewed. No pertinent family history.  Social History Social History   Tobacco Use  . Smoking status: Never Smoker  . Smokeless tobacco: Never Used  Vaping Use  . Vaping Use: Never used  Substance Use Topics  . Alcohol use: No  . Drug use: No     Allergies   Codeine, Lactose intolerance (gi), Peanut butter flavor, Penicillins, Percocet [oxycodone-acetaminophen], Shrimp [shellfish allergy], Sulfa antibiotics, and Vicodin [hydrocodone-acetaminophen]   Review of Systems Review of Systems  Constitutional: Negative for activity change, appetite change and fever.  HENT: Positive for congestion, ear pain, rhinorrhea and sore throat.  Respiratory: Positive for cough, shortness of breath and wheezing.   Gastrointestinal: Negative for abdominal pain, diarrhea, nausea and vomiting.  Musculoskeletal: Positive for arthralgias and myalgias.  Skin: Negative for rash.  Neurological: Positive for headaches.  Hematological: Negative.   Psychiatric/Behavioral: Negative.      Physical Exam Triage Vital Signs ED Triage Vitals  Enc Vitals Group     BP 09/21/20 1635 (!) 151/69     Pulse Rate 09/21/20 1635 81     Resp 09/21/20 1635 18     Temp 09/21/20 1635  98.4 F (36.9 C)     Temp Source 09/21/20 1635 Oral     SpO2 09/21/20 1635 100 %     Weight 09/21/20 1633 130 lb 1.1 oz (59 kg)     Height 09/21/20 1633 5\' 4"  (1.626 m)     Head Circumference --      Peak Flow --      Pain Score 09/21/20 1633 2     Pain Loc --      Pain Edu? --      Excl. in [place]? --    No data found.  Updated Vital Signs BP (!) 151/69 (BP Location: Right Arm)   Pulse 81   Temp 98.4 F (36.9 C) (Oral)   Resp 18   Ht 5\' 4"  (1.626 m)   Wt 130 lb 1.1 oz (59 kg)   SpO2 100%   BMI 22.33 kg/m   Visual Acuity Right Eye Distance:   Left Eye Distance:   Bilateral Distance:    Right Eye Near:   Left Eye Near:    Bilateral Near:     Physical Exam Vitals and nursing note reviewed.  Constitutional:      General: She is not in acute distress.    Appearance: Normal appearance. She is normal weight. She is not toxic-appearing.  HENT:     Head: Normocephalic and atraumatic.     Right Ear: Tympanic membrane, ear canal and external ear normal.     Left Ear: Tympanic membrane, ear canal and external ear normal.     Nose: Congestion and rhinorrhea present.     Mouth/Throat:     Mouth: Mucous membranes are moist.     Pharynx: Oropharynx is clear. Posterior oropharyngeal erythema present.  Cardiovascular:     Rate and Rhythm: Normal rate and regular rhythm.     Pulses: Normal pulses.     Heart sounds: Normal heart sounds. No murmur heard. No gallop.   Pulmonary:     Effort: Pulmonary effort is normal.     Breath sounds: Normal breath sounds. No wheezing, rhonchi or rales.  Musculoskeletal:     Cervical back: Normal range of motion and neck supple.  Lymphadenopathy:     Cervical: No cervical adenopathy.  Skin:    General: Skin is warm and dry.     Capillary Refill: Capillary refill takes less than 2 seconds.     Findings: No erythema or rash.  Neurological:     General: No focal deficit present.     Mental Status: She is alert and oriented to person, place,  and time.  Psychiatric:        Mood and Affect: Mood normal.        Behavior: Behavior normal.        Thought Content: Thought content normal.        Judgment: Judgment normal.      UC Treatments / Results  Labs (all labs ordered are  listed, but only abnormal results are displayed) Labs Reviewed  SARS CORONAVIRUS 2 (TAT 6-24 HRS)    EKG   Radiology No results found.  Procedures Procedures (including critical care time)  Medications Ordered in UC Medications - No data to display  Initial Impression / Assessment and Plan / UC Course  I have reviewed the triage vital signs and the nursing notes.  Pertinent labs & imaging results that were available during my care of the patient were reviewed by me and considered in my medical decision making (see chart for details).   Evaluation of cold symptoms that been going for the past 3 days.  Physical exam reveals erythematous and edematous nasal mucosa with clear nasal discharge.  Posterior oropharynx is erythematous with clear postnasal drip.  No cervical lymphadenopathy appreciated exam.  Lungs are clear to auscultation in all fields.  Patient has been vaccinated against Covid but has not received her flu shot and she has not received her Covid booster.  Patient is daughter is a Engineer, civil (consulting) and has similar symptoms.  Will send Covid swab and discharge patient home to isolate pending the results.  Will have patient use her albuterol inhaler for shortness of breath and wheezing and I will give Tessalon Perles and Promethazine DM for cough and congestion.  Patient given ER precautions.   Final Clinical Impressions(s) / UC Diagnoses   Final diagnoses:  Viral URI with cough     Discharge Instructions     Isolate at home until the results of your Covid swab are back.  If they are positive you will need to quarantine for 10 days from when your symptoms started.  After the 10 days you can break quarantine if your symptoms have improved and you  have not had a fever for 24 hours without taking Tylenol or ibuprofen.  Use Tylenol and ibuprofen as needed for body aches and fever.  Use your albuterol inhaler as needed for shortness of breath and wheezing.  Use the Tessalon Perles during the day for cough and the Promethazine DM at nighttime for cough, congestion, and sleep.  The cough syrup will make you drowsy.  If you develop worsening shortness of breath, especially at rest, you are not able to speak in full sentences, or you develop bluing of your lips you need to go to the ER for evaluation.    ED Prescriptions    Medication Sig Dispense Auth. Provider   benzonatate (TESSALON) 100 MG capsule Take 2 capsules (200 mg total) by mouth every 8 (eight) hours. 21 capsule Becky Augusta, NP   promethazine-dextromethorphan (PROMETHAZINE-DM) 6.25-15 MG/5ML syrup Take 5 mLs by mouth 4 (four) times daily as needed. 118 mL Becky Augusta, NP     PDMP not reviewed this encounter.   Becky Augusta, NP 09/21/20 1651

## 2020-09-21 NOTE — ED Triage Notes (Signed)
Patient c/o cough and bilateral ear pain, headache that started on Sunday.

## 2020-09-22 LAB — SARS CORONAVIRUS 2 (TAT 6-24 HRS): SARS Coronavirus 2: POSITIVE — AB

## 2020-10-26 ENCOUNTER — Encounter: Payer: Self-pay | Admitting: Emergency Medicine

## 2020-10-26 ENCOUNTER — Other Ambulatory Visit: Payer: Self-pay

## 2020-10-26 ENCOUNTER — Ambulatory Visit
Admission: EM | Admit: 2020-10-26 | Discharge: 2020-10-26 | Disposition: A | Discharge status: Home / Self Care | Payer: BLUE CROSS/BLUE SHIELD | Attending: Family Medicine | Admitting: Family Medicine

## 2020-10-26 DIAGNOSIS — S39012A Strain of muscle, fascia and tendon of lower back, initial encounter: Secondary | ICD-10-CM | POA: Diagnosis not present

## 2020-10-26 MED ORDER — KETOROLAC TROMETHAMINE 10 MG PO TABS: 10.0000 mg | ORAL_TABLET | Freq: Four times a day (QID) | ORAL | 0 refills | Status: DC | PRN

## 2020-10-26 MED ORDER — TIZANIDINE HCL 4 MG PO TABS: 4.0000 mg | ORAL_TABLET | Freq: Three times a day (TID) | ORAL | 0 refills | Status: DC | PRN

## 2020-10-26 NOTE — ED Provider Notes (Signed)
[facility]    CSN: [id number] Arrival date & time: 10/26/20  1716      History   Chief Complaint Chief Complaint  Patient presents with  . Back Pain   HPI  60 year old female presents with back pain.  Patient states that her back pain started approximately 1 hour ago.  It occurred when she turned the wrong way while she was cleaning.  She localizes the pain to the left low back.  Pain is severe.  10/10 in severity.  She has difficulty sitting down due to the pain.  Worse with range of motion.  No medications or interventions tried.  No relieving factors.  No radicular symptoms.  No other complaints.  Past Medical History:  Diagnosis Date  . Arthritis   . Asthma   . Heart murmur   . Hypertension     Patient Active Problem List   Diagnosis Date Noted  . Chest pain 11/05/2015  . Hypertension 11/05/2015  . Asthma 11/05/2015    Past Surgical History:  Procedure Laterality Date  . ABDOMINAL HYSTERECTOMY    . BROW LIFT Bilateral 02/05/2020   Procedure: BLEPHAROPLASTY UPPER EYELID; W/EXCESS SKIN BLEPHAROPTOSIS REPAIR; RESECT EX;  Surgeon: Imagene Riches, MD;  Location: [facility];  Service: Ophthalmology;  Laterality: Bilateral;  . FOOT SURGERY Left   . HAND SURGERY Right     OB History   No obstetric history on file.      Home Medications    Prior to Admission medications   Medication Sig Start Date End Date Taking? Authorizing Provider  albuterol (PROVENTIL HFA;VENTOLIN HFA) 108 (90 Base) MCG/ACT inhaler Inhale 2 puffs into the lungs every 4 (four) hours as needed for wheezing or shortness of breath. 11/05/15  Yes Katha Hamming, MD  amLODipine (NORVASC) 5 MG tablet Take 5 mg by mouth daily.   Yes [provider]  fluticasone (FLONASE) 50 MCG/ACT nasal spray Place into both nostrils daily.   Yes [provider]  fluticasone (FLOVENT HFA) 110 MCG/ACT inhaler Inhale into the lungs 2 (two) times daily.   Yes [provider]  ketorolac (TORADOL) 10 MG tablet Take 1 tablet (10 mg total) by mouth every 6 (six) hours as needed for moderate pain or severe pain. 10/26/20  Yes Milady Fleener G, DO  tiZANidine (ZANAFLEX) 4 MG tablet Take 1 tablet (4 mg total) by mouth every 8 (eight) hours as needed for muscle spasms. 10/26/20  Yes Tamanna Whitson G, DO  traMADol (ULTRAM) 50 MG tablet Take 1 every 4-6 hours as needed for pain not controlled by Tylenol 02/05/20   Imagene Riches, MD  loratadine (CLARITIN) 10 MG tablet Take 10 mg by mouth daily as needed for allergies.  10/26/20  [provider]   Social History Social History   Tobacco Use  . Smoking status: Never Smoker  . Smokeless tobacco: Never Used  Vaping Use  . Vaping Use: Never used  Substance Use Topics  . Alcohol use: No  . Drug use: No     Allergies   Codeine, Lactose intolerance (gi), Peanut butter flavor, Penicillins, Percocet [oxycodone-acetaminophen], Shrimp [shellfish allergy], Sulfa antibiotics, and Vicodin [hydrocodone-acetaminophen]   Review of Systems Review of Systems  Constitutional: Negative.   Musculoskeletal: Positive for back pain.   Physical Exam Triage Vital Signs ED Triage Vitals  Enc Vitals Group     BP 10/26/20 1732 (!) 144/80     Pulse Rate 10/26/20 1732 65     Resp 10/26/20  1732 18     Temp 10/26/20 1732 97.7 F (36.5 C)     Temp Source 10/26/20 1732 Oral     SpO2 10/26/20 1732 100 %     Weight 10/26/20 1728 130 lb 1.1 oz (59 kg)     Height 10/26/20 1728 5\' 4"  (1.626 m)     Head Circumference --      Peak Flow --      Pain Score 10/26/20 1728 10     Pain Loc --      Pain Edu? --      Excl. in [place]? --    Updated Vital Signs BP (!) 144/80 (BP Location: Left Arm)   Pulse 65   Temp 97.7 F (36.5 C) (Oral)   Resp 18   Ht 5\' 4"  (1.626 m)   Wt 59 kg   SpO2 100%   BMI 22.33 kg/m   Visual Acuity Right Eye Distance:   Left Eye Distance:   Bilateral Distance:    Right Eye Near:   Left Eye Near:     Bilateral Near:     Physical Exam Vitals and nursing note reviewed.  Constitutional:      Comments: Patient appears in pain but she is in no acute distress.  HENT:     Head: Normocephalic and atraumatic.  Eyes:     General:        Right eye: No discharge.        Left eye: No discharge.     Conjunctiva/sclera: Conjunctivae normal.  Cardiovascular:     Rate and Rhythm: Normal rate and regular rhythm.  Pulmonary:     Effort: Pulmonary effort is normal.     Breath sounds: Normal breath sounds. No wheezing, rhonchi or rales.  Musculoskeletal:     Comments: Left lumbar spine -paraspinal musculature with spasm.  Exquisite tenderness to palpation.  Neurological:     Mental Status: She is alert.  Psychiatric:        Mood and Affect: Mood normal.        Behavior: Behavior normal.    UC Treatments / Results  Labs (all labs ordered are listed, but only abnormal results are displayed) Labs Reviewed - No data to display  EKG   Radiology No results found.  Procedures Procedures (including critical care time)  Medications Ordered in UC Medications - No data to display  Initial Impression / Assessment and Plan / UC Course  I have reviewed the triage vital signs and the nursing notes.  Pertinent labs & imaging results that were available during my care of the patient were reviewed by me and considered in my medical decision making (see chart for details).    60 year old female presents with lumbar strain.  Treating with Zanaflex and Toradol.  Supportive care.  Final Clinical Impressions(s) / UC Diagnoses   Final diagnoses:  Strain of lumbar region, initial encounter   Discharge Instructions   None    ED Prescriptions    Medication Sig Dispense Auth. Provider   tiZANidine (ZANAFLEX) 4 MG tablet Take 1 tablet (4 mg total) by mouth every 8 (eight) hours as needed for muscle spasms. 30 tablet Joeanne Robicheaux G, DO   ketorolac (TORADOL) 10 MG tablet Take 1 tablet (10 mg total)  by mouth every 6 (six) hours as needed for moderate pain or severe pain. 20 tablet 46, DO     PDMP not reviewed this encounter.   10-22-2003, Tommie Sams 10/26/20 2014

## 2020-10-26 NOTE — ED Triage Notes (Signed)
Pt c/o lower back pain. Started about an hour ago. She states she was cleaning some ones house and turn the wrong way. Pain is also in her left buttock.

## 2021-08-16 DIAGNOSIS — Z90710 Acquired absence of both cervix and uterus: Secondary | ICD-10-CM | POA: Insufficient documentation

## 2021-09-13 DIAGNOSIS — K580 Irritable bowel syndrome with diarrhea: Secondary | ICD-10-CM | POA: Insufficient documentation

## 2021-12-12 HISTORY — PX: FOOT SURGERY: SHX648

## 2021-12-16 ENCOUNTER — Other Ambulatory Visit: Payer: Self-pay

## 2021-12-16 ENCOUNTER — Ambulatory Visit
Admission: EM | Admit: 2021-12-16 | Discharge: 2021-12-16 | Disposition: A | Discharge status: Home / Self Care | Payer: BLUE CROSS/BLUE SHIELD

## 2021-12-16 DIAGNOSIS — M79671 Pain in right foot: Secondary | ICD-10-CM

## 2021-12-16 DIAGNOSIS — M722 Plantar fascial fibromatosis: Secondary | ICD-10-CM

## 2021-12-16 MED ORDER — GABAPENTIN 300 MG PO CAPS: 300.0000 mg | ORAL_CAPSULE | Freq: Three times a day (TID) | ORAL | 0 refills | Status: AC

## 2021-12-16 NOTE — ED Provider Notes (Signed)
?[place] ? ? ? ?CSN: [id number] ?Arrival date & time: 12/16/21  1050 ? ? ?  ? ?History   ?Chief Complaint ?Chief Complaint  ?Patient presents with  ? Toe Pain  ? ? ?HPI ?Cindy Fisher is a 61 y.o. female presenting for right great toe pain with pain into the right first MTP and plantar right foot pain.  Patient has a history of these issues and has been seeing a podiatrist.  On 12/12/2021, she had her great toenail removed since it was ingrown.  She had previously been on gabapentin and was supposed of had a refill sent to pharmacy.  Apparently there was an issue with getting the medication so she has been out of it.  She asks for refill of that today.  She has an appointment to follow-up with the podiatrist in about 2 weeks.  No signs of infection.  Denies fever, increased swelling, redness or pustular drainage.  Has been keeping area clean and bandaged.  No other complaints. ? ?HPI ? ?Past Medical History:  ?Diagnosis Date  ? Arthritis   ? Asthma   ? Heart murmur   ? Hypertension   ? ? ?Patient Active Problem List  ? Diagnosis Date Noted  ? Chest pain 11/05/2015  ? Hypertension 11/05/2015  ? Asthma 11/05/2015  ? ? ?Past Surgical History:  ?Procedure Laterality Date  ? ABDOMINAL HYSTERECTOMY    ? BROW LIFT Bilateral 02/05/2020  ? Procedure: BLEPHAROPLASTY UPPER EYELID; W/EXCESS SKIN BLEPHAROPTOSIS REPAIR; RESECT EX;  Surgeon: Karle Starch, MD;  Location: [place];  Service: Ophthalmology;  Laterality: Bilateral;  ? FOOT SURGERY Left   ? FOOT SURGERY Right 12/12/2021  ? HAND SURGERY Right   ? ? ?OB History   ?No obstetric history on file. ?  ? ? ? ?Home Medications   ? ?Prior to Admission medications   ?Medication Sig Start Date End Date Taking? Authorizing Provider  ?albuterol (PROVENTIL HFA;VENTOLIN HFA) 108 (90 Base) MCG/ACT inhaler Inhale 2 puffs into the lungs every 4 (four) hours as needed for wheezing or shortness of breath. 11/05/15  Yes Epifanio Lesches, MD  ?amLODipine (NORVASC)  5 MG tablet Take 5 mg by mouth daily.   Yes [provider]  ?fluticasone (FLONASE) 50 MCG/ACT nasal spray Place into both nostrils daily.   Yes [provider]  ?fluticasone (FLOVENT HFA) 110 MCG/ACT inhaler Inhale into the lungs 2 (two) times daily.   Yes [provider]  ?ketorolac (TORADOL) 10 MG tablet Take 1 tablet (10 mg total) by mouth every 6 (six) hours as needed for moderate pain or severe pain. 10/26/20  Yes Cook, Jayce G, DO  ?tiZANidine (ZANAFLEX) 4 MG tablet Take 1 tablet (4 mg total) by mouth every 8 (eight) hours as needed for muscle spasms. 10/26/20  Yes Coral Spikes, DO  ?traMADol (ULTRAM) 50 MG tablet Take 1 every 4-6 hours as needed for pain not controlled by Tylenol 02/05/20  Yes Karle Starch, MD  ?gabapentin (NEURONTIN) 300 MG capsule Take 1 capsule (300 mg total) by mouth 3 (three) times daily for 10 days. 12/16/21 12/26/21  Danton Clap, PA-C  ?loratadine (CLARITIN) 10 MG tablet Take 10 mg by mouth daily as needed for allergies.  10/26/20  [provider]  ? ? ?Family History ?History reviewed. No pertinent family history. ? ?Social History ?Social History  ? ?Tobacco Use  ? Smoking status: Never  ? Smokeless tobacco: Never  ?Vaping Use  ? Vaping Use: Never  used  ?Substance Use Topics  ? Alcohol use: No  ? Drug use: No  ? ? ? ?Allergies   ?Codeine, Lactose intolerance (gi), Peanut butter flavor, Penicillins, Percocet [oxycodone-acetaminophen], Shrimp [shellfish allergy], Sulfa antibiotics, and Vicodin [hydrocodone-acetaminophen] ? ? ?Review of Systems ?Review of Systems  ?Constitutional:  Negative for fatigue and fever.  ?Musculoskeletal:  Positive for arthralgias and gait problem. Negative for joint swelling.  ?Skin:  Negative for color change.  ?Neurological:  Negative for weakness and numbness.  ? ? ?Physical Exam ?Triage Vital Signs ?ED Triage Vitals  ?Enc Vitals Group  ?   BP 12/16/21 1116 120/74  ?   Pulse Rate 12/16/21 1116 (!) 58  ?   Resp 12/16/21 1116  18  ?   Temp 12/16/21 1116 98 ?F (36.7 ?C)  ?   Temp Source 12/16/21 1116 Oral  ?   SpO2 12/16/21 1116 97 %  ?   Weight 12/16/21 1113 140 lb (63.5 kg)  ?   Height 12/16/21 1113 5\' 5"  (1.651 m)  ?   Head Circumference --   ?   Peak Flow --   ?   Pain Score 12/16/21 1112 10  ?   Pain Loc --   ?   Pain Edu? --   ?   Excl. in [place]? --   ? ?No data found. ? ?Updated Vital Signs ?BP 120/74 (BP Location: Left Arm)   Pulse (!) 58   Temp 98 ?F (36.7 ?C) (Oral)   Resp 18   Ht 5\' 5"  (1.651 m)   Wt 140 lb (63.5 kg)   SpO2 97%   BMI 23.30 kg/m?  ? ? ?Physical Exam ?Vitals and nursing note reviewed.  ?Constitutional:   ?   General: She is not in acute distress. ?   Appearance: Normal appearance. She is not ill-appearing or toxic-appearing.  ?HENT:  ?   Head: Normocephalic and atraumatic.  ?Eyes:  ?   General: No scleral icterus.    ?   Right eye: No discharge.     ?   Left eye: No discharge.  ?   Conjunctiva/sclera: Conjunctivae normal.  ?Cardiovascular:  ?   Rate and Rhythm: Regular rhythm. Bradycardia present.  ?   Pulses: Normal pulses.  ?Pulmonary:  ?   Effort: Pulmonary effort is normal. No respiratory distress.  ?Musculoskeletal:  ?   Cervical back: Neck supple.  ?   Comments: Right foot: Great toenail has been removed.  There is no swelling, erythema or pustular drainage.  Area is tender to palpation.  She also has TTP of the first MTP joint and there is bruising in this region.  Reports this happens secondary to surgery.  Patient also has tenderness to palpation of the plantar aspect of her foot diffusely.  ?Skin: ?   General: Skin is dry.  ?Neurological:  ?   General: No focal deficit present.  ?   Mental Status: She is alert. Mental status is at baseline.  ?   Motor: No weakness.  ?   Gait: Gait abnormal.  ?Psychiatric:     ?   Mood and Affect: Mood normal.     ?   Behavior: Behavior normal.     ?   Thought Content: Thought content normal.  ? ? ? ?UC Treatments / Results  ?Labs ?(all labs ordered are listed, but  only abnormal results are displayed) ?Labs Reviewed - No data to display ? ?EKG ? ? ?Radiology ?No results found. ? ?Procedures ?Procedures (  including critical care time) ? ?Medications Ordered in UC ?Medications - No data to display ? ?Initial Impression / Assessment and Plan / UC Course  ?I have reviewed the triage vital signs and the nursing notes. ? ?Pertinent labs & imaging results that were available during my care of the patient were reviewed by me and considered in my medical decision making (see chart for details). ? ?61 year old female presenting for medication refill request.  Patient requests refill of gabapentin.  She has been taking that for right plantar fasciitis pain and for pain related to ingrown toenail.  Patient recently had the toenail removed on 12/12/2021 but has had an issue getting the gabapentin refill.  Today, patient has exam consistent with removed great toenail, some swelling and bruising of the first MTP and pain of the plantar aspect of the foot.  No evidence of infection.  [facility] was contacted and they stated that the medication was sent and then immediately canceled.  I reviewed the podiatrist note it states that they sent a refill for the medication.  Looks like it was supposed to be sent for 10 days.  I have refilled the medication for patient.  Advised her to follow-up up with the podiatrist or return here for any signs of infection or other concerns as needed. ? ? ?Final Clinical Impressions(s) / UC Diagnoses  ? ?Final diagnoses:  ?Right foot pain  ?Plantar fasciitis  ? ? ? ?Discharge Instructions   ? ?  ?-I have sent in more gabapentin for you.  Follow your wound care guidelines as discussed with you by the podiatrist and keep your follow-up appointment with him.  Follow-up here as needed if you develop any signs of infection in your foot including redness, swelling, fever. ? ? ? ? ?ED Prescriptions   ? ? Medication Sig Dispense Auth. Provider  ? gabapentin  (NEURONTIN) 300 MG capsule Take 1 capsule (300 mg total) by mouth 3 (three) times daily for 10 days. 30 capsule Danton Clap, PA-C  ? ?  ? ?PDMP not reviewed this encounter. ?  ?Danton Clap, PA-C ?12/16/21 1209

## 2021-12-16 NOTE — Discharge Instructions (Signed)
-  I have sent in more gabapentin for you.  Follow your wound care guidelines as discussed with you by the podiatrist and keep your follow-up appointment with him.  Follow-up here as needed if you develop any signs of infection in your foot including redness, swelling, fever. ?

## 2021-12-16 NOTE — ED Triage Notes (Signed)
Pt c/o right foot pain x4days.  ? ?Pt states that she had surgery on the left foot and was given a Gabapentin 300mg  for pain. Pt then had surgery on her right foot and continued to take the medication. Pt is asking for a refill on the medication as the surgeons office will not refill it at this time.  ?

## 2022-05-31 ENCOUNTER — Ambulatory Visit
Admission: EM | Admit: 2022-05-31 | Discharge: 2022-05-31 | Disposition: A | Discharge status: Home / Self Care | Payer: BLUE CROSS/BLUE SHIELD | Attending: Family Medicine | Admitting: Family Medicine

## 2022-05-31 DIAGNOSIS — Z20822 Contact with and (suspected) exposure to covid-19: Secondary | ICD-10-CM | POA: Insufficient documentation

## 2022-05-31 DIAGNOSIS — S161XXA Strain of muscle, fascia and tendon at neck level, initial encounter: Secondary | ICD-10-CM | POA: Insufficient documentation

## 2022-05-31 LAB — SARS CORONAVIRUS 2 BY RT PCR: SARS Coronavirus 2 by RT PCR: NEGATIVE

## 2022-05-31 MED ORDER — TIZANIDINE HCL 4 MG PO TABS: 4.0000 mg | ORAL_TABLET | Freq: Three times a day (TID) | ORAL | 0 refills | Status: DC | PRN

## 2022-05-31 NOTE — Discharge Instructions (Signed)
Take 2 tablets of Tylenol up to 3 times a day as needed for pain.  Stop by the pharmacy to pick up your muscle relaxer, tizanidine/Zanaflex.  Do not drive or operate heavy machinery when using this medicine.  I will call you if your COVID test is positive.

## 2022-05-31 NOTE — ED Triage Notes (Signed)
Pt states granddaughter tested positive for covid, pt states she lives in the same household. Pt states she asymptomatic so far

## 2022-05-31 NOTE — ED Provider Notes (Signed)
[facility]    CSN: [id number] Arrival date & time: 05/31/22  1653      History   Chief Complaint Chief Complaint  Patient presents with   Covid Exposure    HPI Cindy Fisher is a 61 y.o. female.   HPI   Cindy Fisher presents for COVID exposure.  Says that her daughter and her granddaughter have COVID.  She was last around them on Saturday.  She has no fever, chills, sore throat, cough, nasal congestion, runny nose, nausea, vomiting, diarrhea, chest pain, abdominal pain.  Has a history of asthma.  Denies any wheezing or shortness of breath.  Endorses neck pain.  Says that she may have slept on it wrong yesterday.  She feels like her muscles are very tight.  She occasionally has to lift heavy things at work.  No new back pain, headache.     Past Medical History:  Diagnosis Date   Arthritis    Asthma    Heart murmur    Hypertension     Patient Active Problem List   Diagnosis Date Noted   Chest pain 11/05/2015   Hypertension 11/05/2015   Asthma 11/05/2015    Past Surgical History:  Procedure Laterality Date   ABDOMINAL HYSTERECTOMY     BROW LIFT Bilateral 02/05/2020   Procedure: BLEPHAROPLASTY UPPER EYELID; W/EXCESS SKIN BLEPHAROPTOSIS REPAIR; RESECT EX;  Surgeon: Imagene Riches, MD;  Location: [facility];  Service: Ophthalmology;  Laterality: Bilateral;   FOOT SURGERY Left    FOOT SURGERY Right 12/12/2021   HAND SURGERY Right     OB History   No obstetric history on file.      Home Medications    Prior to Admission medications   Medication Sig Start Date End Date Taking? Authorizing Provider  amLODipine (NORVASC) 5 MG tablet Take 5 mg by mouth daily.   Yes [provider]  albuterol (PROVENTIL HFA;VENTOLIN HFA) 108 (90 Base) MCG/ACT inhaler Inhale 2 puffs into the lungs every 4 (four) hours as needed for wheezing or shortness of breath. 11/05/15   Katha Hamming, MD  fluticasone (FLONASE) 50 MCG/ACT nasal spray Place into  both nostrils daily.    [provider]  fluticasone (FLOVENT HFA) 110 MCG/ACT inhaler Inhale into the lungs 2 (two) times daily.    [provider]  gabapentin (NEURONTIN) 300 MG capsule Take 1 capsule (300 mg total) by mouth 3 (three) times daily for 10 days. 12/16/21 12/26/21  Eusebio Friendly B, PA-C  ketorolac (TORADOL) 10 MG tablet Take 1 tablet (10 mg total) by mouth every 6 (six) hours as needed for moderate pain or severe pain. 10/26/20   Tommie Sams, DO  tiZANidine (ZANAFLEX) 4 MG tablet Take 1 tablet (4 mg total) by mouth every 8 (eight) hours as needed for muscle spasms. 05/31/22   Katha Cabal, DO  traMADol (ULTRAM) 50 MG tablet Take 1 every 4-6 hours as needed for pain not controlled by Tylenol 02/05/20   Imagene Riches, MD  loratadine (CLARITIN) 10 MG tablet Take 10 mg by mouth daily as needed for allergies.  10/26/20  [provider]    Family History History reviewed. No pertinent family history.  Social History Social History   Tobacco Use   Smoking status: Never   Smokeless tobacco: Never  Vaping Use   Vaping Use: Never used  Substance Use Topics   Alcohol use: No   Drug use: No     Allergies   Oxycodone, Lactose, Codeine,  Lactose intolerance (gi), Peanut butter flavor, Penicillins, Percocet [oxycodone-acetaminophen], Shrimp [shellfish allergy], Sulfa antibiotics, Vicodin [hydrocodone-acetaminophen], Hydrocodone, Nisoldipine, and Sulfasalazine   Review of Systems Review of Systems: negative unless otherwise stated in HPI.      Physical Exam Triage Vital Signs ED Triage Vitals  Enc Vitals Group     BP 05/31/22 1716 (!) 159/78     Pulse Rate 05/31/22 1716 67     Resp --      Temp 05/31/22 1716 98 F (36.7 C)     Temp Source 05/31/22 1716 Oral     SpO2 05/31/22 1716 100 %     Weight 05/31/22 1714 139 lb 9.6 oz (63.3 kg)     Height 05/31/22 1714 5\' 4"  (1.626 m)     Head Circumference --      Peak Flow --      Pain Score 05/31/22 1714  0     Pain Loc --      Pain Edu? --      Excl. in [place]? --    No data found.  Updated Vital Signs BP (!) 159/78 (BP Location: Left Arm)   Pulse 67   Temp 98 F (36.7 C) (Oral)   Ht 5\' 4"  (1.626 m)   Wt 63.3 kg   SpO2 100%   BMI 23.96 kg/m   Visual Acuity Right Eye Distance:   Left Eye Distance:   Bilateral Distance:    Right Eye Near:   Left Eye Near:    Bilateral Near:     Physical Exam GEN:     alert, non-ill appearing female in no distress    HENT:  mucus membranes moist, oropharyngeal without lesions or exudate, no tonsillar hypertrophy,  mild oropharyngeal erythema ,  moderate erythematous hypertrophied turbinates, no nasal discharge, bilateral TM normal EYES:   pupils equal and reactive, EOMi, no scleral injection NECK:  normal ROM, no midline spinous process tenderness, hypertonicity of the lateral paraspinals and trapezius, multiple tender points throughout the musculature RESP:  no increased work of breathing, clear to auscultation bilaterally,   CVS:   regular rate and rhythm Skin:   warm and dry, no rash on visible skin, normal skin turgor    UC Treatments / Results  Labs (all labs ordered are listed, but only abnormal results are displayed) Labs Reviewed  SARS CORONAVIRUS 2 BY RT PCR    EKG   Radiology No results found.  Procedures Procedures (including critical care time)  Medications Ordered in UC Medications - No data to display  Initial Impression / Assessment and Plan / UC Course  I have reviewed the triage vital signs and the nursing notes.  Pertinent labs & imaging results that were available during my care of the patient were reviewed by me and considered in my medical decision making (see chart for details).      Patient is a 61 y.o. female with history of asthma presents after having close COVID exposure a few.  She has had COVID once before.  She is COVID vaccinated and boosted.  Vital signs stable.  She is afebrile without recent  use of antipyretics.  Overall she is well-appearing, well-hydrated and in no respiratory distress.  COVID testing obtained and is negative.   Patient with acute neck pain.  Exam concerning for cervical muscle strain.  Refilled patient's tizanidine which has helped her in the past.  She is to use Voltaren gel and Tylenol as needed for pain.  ED and return precautions and  understanding voiced. Discussed MDM, treatment plan and plan for follow-up with patient/parent who agrees with plan.      Final Clinical Impressions(s) / UC Diagnoses   Final diagnoses:  Close exposure to COVID-19 virus  Strain of neck muscle, initial encounter     Discharge Instructions      Take 2 tablets of Tylenol up to 3 times a day as needed for pain.  Stop by the pharmacy to pick up your muscle relaxer, tizanidine/Zanaflex.  Do not drive or operate heavy machinery when using this medicine.  I will call you if your COVID test is positive.     ED Prescriptions     Medication Sig Dispense Auth. Provider   tiZANidine (ZANAFLEX) 4 MG tablet Take 1 tablet (4 mg total) by mouth every 8 (eight) hours as needed for muscle spasms. 30 tablet Katha Cabal, DO      PDMP not reviewed this encounter.   Katha Cabal, DO 05/31/22 2108

## 2022-06-07 ENCOUNTER — Ambulatory Visit
Admission: EM | Admit: 2022-06-07 | Discharge: 2022-06-07 | Disposition: A | Discharge status: Home / Self Care | Payer: BLUE CROSS/BLUE SHIELD | Attending: Emergency Medicine | Admitting: Emergency Medicine

## 2022-06-07 ENCOUNTER — Telehealth: Payer: Self-pay | Admitting: Emergency Medicine

## 2022-06-07 DIAGNOSIS — J029 Acute pharyngitis, unspecified: Secondary | ICD-10-CM | POA: Insufficient documentation

## 2022-06-07 LAB — GROUP A STREP BY PCR: Group A Strep by PCR: NOT DETECTED

## 2022-06-07 MED ORDER — LIDOCAINE VISCOUS HCL 2 % MT SOLN: 15.0000 mL | OROMUCOSAL | 0 refills | Status: DC | PRN

## 2022-06-07 NOTE — Telephone Encounter (Signed)
Notified pharmacy does not have medication available, changed to a local pharmacy and given written prescription to be used as needed

## 2022-06-07 NOTE — ED Triage Notes (Signed)
Pt c/o sore throat x2days.  Pt was here last week for a covid test and it was negative. Pt was around her grandmother and granddaughter who had covid.   Pt states that she has allergies and can't cough up any mucus. Pt states that her throat feels like she is choking.   Pt asks for a strep test to be done.   Pt was told to take Cough syrup from her pharmacist for symptoms.

## 2022-06-07 NOTE — Discharge Instructions (Addendum)
Your symptoms today are most likely being caused by a virus and should steadily improve in time it can take up to 7 to 10 days before you truly start to see a turnaround however things will get better  Strep test is negative for bacteria  May gargle and spit lidocaine solution every 4 hours to provide a temporary numbing effect to the throat    You can take Tylenol and/or Ibuprofen as needed for fever reduction and pain relief.   For sore throat: try warm salt water gargles, cepacol lozenges, throat spray, warm tea or water with lemon/honey, popsicles or ice, or OTC cold relief medicine for throat discomfort.   For congestion: take a daily anti-histamine like Zyrtec, Claritin, and a oral decongestant, such as pseudoephedrine.  You can also use Flonase 1-2 sprays in each nostril daily.   It is important to stay hydrated: drink plenty of fluids (water, gatorade/powerade/pedialyte, juices, or teas) to keep your throat moisturized and help further relieve irritation/discomfort.

## 2022-06-07 NOTE — ED Provider Notes (Signed)
[facility]    CSN: [id number] Arrival date & time: 06/07/22  1242      History   Chief Complaint Chief Complaint  Patient presents with   Sore Throat    HPI Cindy Fisher is a 61 y.o. female.   Patient presents with a sore throat for 2 days.  Painful to swallow but able to tolerate some food and liquids but does have a decreased appetite.  Endorses nasal congestion and sensation of mucus within the throat which she relates to her allergies.  Has attempted use of over-the-counter cold and flu medicine recommended by the pharmacy and antihistamines which have been ineffective.  No known sick contacts.  Was exposed to COVID 1 week ago and completed testing in urgent care at that time negative.  Denies fever, chills or body aches.  Past Medical History:  Diagnosis Date   Arthritis    Asthma    Heart murmur    Hypertension     Patient Active Problem List   Diagnosis Date Noted   Chest pain 11/05/2015   Hypertension 11/05/2015   Asthma 11/05/2015    Past Surgical History:  Procedure Laterality Date   ABDOMINAL HYSTERECTOMY     BROW LIFT Bilateral 02/05/2020   Procedure: BLEPHAROPLASTY UPPER EYELID; W/EXCESS SKIN BLEPHAROPTOSIS REPAIR; RESECT EX;  Surgeon: Imagene Riches, MD;  Location: [facility];  Service: Ophthalmology;  Laterality: Bilateral;   FOOT SURGERY Left    FOOT SURGERY Right 12/12/2021   HAND SURGERY Right     OB History   No obstetric history on file.      Home Medications    Prior to Admission medications   Medication Sig Start Date End Date Taking? Authorizing Provider  albuterol (PROVENTIL HFA;VENTOLIN HFA) 108 (90 Base) MCG/ACT inhaler Inhale 2 puffs into the lungs every 4 (four) hours as needed for wheezing or shortness of breath. 11/05/15  Yes Katha Hamming, MD  amLODipine (NORVASC) 5 MG tablet Take 5 mg by mouth daily.   Yes [provider]  fluticasone (FLONASE) 50 MCG/ACT nasal spray Place into both  nostrils daily.   Yes [provider]  fluticasone (FLOVENT HFA) 110 MCG/ACT inhaler Inhale into the lungs 2 (two) times daily.   Yes [provider]  tiZANidine (ZANAFLEX) 4 MG tablet Take 1 tablet (4 mg total) by mouth every 8 (eight) hours as needed for muscle spasms. 05/31/22  Yes Brimage, Seward Meth, DO  traMADol (ULTRAM) 50 MG tablet Take 1 every 4-6 hours as needed for pain not controlled by Tylenol 02/05/20  Yes Imagene Riches, MD  gabapentin (NEURONTIN) 300 MG capsule Take 1 capsule (300 mg total) by mouth 3 (three) times daily for 10 days. 12/16/21 12/26/21  Eusebio Friendly B, PA-C  ketorolac (TORADOL) 10 MG tablet Take 1 tablet (10 mg total) by mouth every 6 (six) hours as needed for moderate pain or severe pain. 10/26/20   Tommie Sams, DO  loratadine (CLARITIN) 10 MG tablet Take 10 mg by mouth daily as needed for allergies.  10/26/20  [provider]    Family History History reviewed. No pertinent family history.  Social History Social History   Tobacco Use   Smoking status: Never   Smokeless tobacco: Never  Vaping Use   Vaping Use: Never used  Substance Use Topics   Alcohol use: No   Drug use: No     Allergies   Oxycodone, Lactose, Codeine, Lactose intolerance (gi), Peanut butter flavor, Penicillins, Percocet [oxycodone-acetaminophen],  Shrimp [shellfish allergy], Sulfa antibiotics, Vicodin [hydrocodone-acetaminophen], Hydrocodone, Nisoldipine, and Sulfasalazine   Review of Systems Review of Systems  Constitutional: Negative.   HENT:  Positive for sore throat. Negative for congestion, dental problem, drooling, ear discharge, ear pain, facial swelling, hearing loss, mouth sores, nosebleeds, postnasal drip, rhinorrhea, sinus pressure, sinus pain, sneezing, tinnitus, trouble swallowing and voice change.   Respiratory: Negative.    Cardiovascular: Negative.      Physical Exam Triage Vital Signs ED Triage Vitals  Enc Vitals Group     BP 06/07/22 1258  127/70     Pulse Rate 06/07/22 1258 78     Resp 06/07/22 1258 18     Temp 06/07/22 1258 98.3 F (36.8 C)     Temp Source 06/07/22 1258 Oral     SpO2 06/07/22 1258 96 %     Weight 06/07/22 1256 139 lb (63 kg)     Height 06/07/22 1256 5\' 4"  (1.626 m)     Head Circumference --      Peak Flow --      Pain Score 06/07/22 1255 10     Pain Loc --      Pain Edu? --      Excl. in [place]? --    No data found.  Updated Vital Signs BP 127/70 (BP Location: Left Arm)   Pulse 78   Temp 98.3 F (36.8 C) (Oral)   Resp 18   Ht 5\' 4"  (1.626 m)   Wt 139 lb (63 kg)   SpO2 96%   BMI 23.86 kg/m   Visual Acuity Right Eye Distance:   Left Eye Distance:   Bilateral Distance:    Right Eye Near:   Left Eye Near:    Bilateral Near:     Physical Exam Constitutional:      Appearance: She is well-developed.  HENT:     Head: Normocephalic.     Right Ear: Tympanic membrane and ear canal normal.     Left Ear: Tympanic membrane normal.     Nose: Congestion and rhinorrhea present.     Mouth/Throat:     Mouth: Mucous membranes are moist.     Pharynx: Posterior oropharyngeal erythema present.     Tonsils: No tonsillar exudate. 0 on the right. 0 on the left.  Cardiovascular:     Rate and Rhythm: Normal rate and regular rhythm.     Heart sounds: Normal heart sounds.  Pulmonary:     Effort: Pulmonary effort is normal.     Breath sounds: Normal breath sounds.  Musculoskeletal:     Cervical back: Normal range of motion.  Lymphadenopathy:     Cervical: Cervical adenopathy present.  Skin:    General: Skin is warm and dry.  Neurological:     General: No focal deficit present.     Mental Status: She is alert and oriented to person, place, and time.  Psychiatric:        Mood and Affect: Mood normal.        Behavior: Behavior normal.      UC Treatments / Results  Labs (all labs ordered are listed, but only abnormal results are displayed) Labs Reviewed  GROUP A STREP BY PCR     EKG   Radiology No results found.  Procedures Procedures (including critical care time)  Medications Ordered in UC Medications - No data to display  Initial Impression / Assessment and Plan / UC Course  I have reviewed the triage vital signs and the nursing  notes.  Pertinent labs & imaging results that were available during my care of the patient were reviewed by me and considered in my medical decision making (see chart for details).  Viral pharyngitis  Strep PCR negative, etiology is most likely viral, discussed with patient, no erythema, tonsillar adenopathy or exudate is noted on exam, viscous lidocaine prescribed , recommended additional supportive measures, may follow-up with urgent care as needed Final Clinical Impressions(s) / UC Diagnoses   Final diagnoses:  None   Discharge Instructions   None    ED Prescriptions   None    PDMP not reviewed this encounter.   Valinda Hoar, NP 06/07/22 1419

## 2022-07-17 DIAGNOSIS — M722 Plantar fascial fibromatosis: Secondary | ICD-10-CM | POA: Insufficient documentation

## 2022-11-29 ENCOUNTER — Other Ambulatory Visit: Payer: Self-pay

## 2022-11-29 ENCOUNTER — Ambulatory Visit
Admission: EM | Admit: 2022-11-29 | Discharge: 2022-11-29 | Disposition: A | Discharge status: Home / Self Care | Payer: BLUE CROSS/BLUE SHIELD | Attending: Emergency Medicine | Admitting: Emergency Medicine

## 2022-11-29 ENCOUNTER — Ambulatory Visit (INDEPENDENT_AMBULATORY_CARE_PROVIDER_SITE_OTHER): Payer: BLUE CROSS/BLUE SHIELD

## 2022-11-29 ENCOUNTER — Ambulatory Visit: Payer: BLUE CROSS/BLUE SHIELD

## 2022-11-29 DIAGNOSIS — S92414A Nondisplaced fracture of proximal phalanx of right great toe, initial encounter for closed fracture: Secondary | ICD-10-CM

## 2022-11-29 NOTE — Discharge Instructions (Addendum)
You have broken one of the bones in your right big toe.  It should heal well on its own but will take time.  Wear the postop shoe to help prevent flexion of your toe and prevent further injury.  Use over-the-counter Tylenol and/or ibuprofen according to package instructions as needed for pain.  Elevate your right foot is much as possible to help with swelling and aid in pain relief.  You can apply ice to your toe for 20 minutes at a time 2-3 times a day to help with pain and swelling.  If your symptoms do not improve, or new symptoms develop, either return for reevaluation or see your primary care provider.

## 2022-11-29 NOTE — ED Provider Notes (Signed)
[facility]    CSN: [id number] Arrival date & time: 11/29/22  1855      History   Chief Complaint Chief Complaint  Patient presents with   Toe Injury    HPI Cindy Fisher is a 62 y.o. female.   HPI  62 year old female here for evaluation of left big toe pain.  Patient has a past medical history that is significant for asthma, hypertension, heart murmur, and arthritis presenting for evaluation of pain in the middle of her left big toe on the top.  She states that she tripped over a baby gate and forcibly flexed her toe back.  She states that it is throbbing and it hurts to walk and bear weight.  She denies any numbness or tingling in her toe.  Past Medical History:  Diagnosis Date   Arthritis    Asthma    Heart murmur    Hypertension     Patient Active Problem List   Diagnosis Date Noted   Chest pain 11/05/2015   Hypertension 11/05/2015   Asthma 11/05/2015    Past Surgical History:  Procedure Laterality Date   ABDOMINAL HYSTERECTOMY     BROW LIFT Bilateral 02/05/2020   Procedure: BLEPHAROPLASTY UPPER EYELID; W/EXCESS SKIN BLEPHAROPTOSIS REPAIR; RESECT EX;  Surgeon: Karle Starch, MD;  Location: [facility];  Service: Ophthalmology;  Laterality: Bilateral;   FOOT SURGERY Left    FOOT SURGERY Right 12/12/2021   HAND SURGERY Right     OB History   No obstetric history on file.      Home Medications    Prior to Admission medications   Medication Sig Start Date End Date Taking? Authorizing Provider  albuterol (PROVENTIL HFA;VENTOLIN HFA) 108 (90 Base) MCG/ACT inhaler Inhale 2 puffs into the lungs every 4 (four) hours as needed for wheezing or shortness of breath. 11/05/15   Epifanio Lesches, MD  amLODipine (NORVASC) 5 MG tablet Take 5 mg by mouth daily.    [provider]  fluticasone (FLONASE) 50 MCG/ACT nasal spray Place into both nostrils daily.    [provider]  fluticasone (FLOVENT HFA) 110 MCG/ACT inhaler  Inhale into the lungs 2 (two) times daily.    [provider]  gabapentin (NEURONTIN) 300 MG capsule Take 1 capsule (300 mg total) by mouth 3 (three) times daily for 10 days. 12/16/21 12/26/21  Laurene Footman B, PA-C  ketorolac (TORADOL) 10 MG tablet Take 1 tablet (10 mg total) by mouth every 6 (six) hours as needed for moderate pain or severe pain. 10/26/20   Coral Spikes, DO  lidocaine (XYLOCAINE) 2 % solution Use as directed 15 mLs in the mouth or throat every 4 (four) hours as needed for mouth pain. 06/07/22   White, Leitha Schuller, NP  lidocaine (XYLOCAINE) 2 % solution Use as directed 15 mLs in the mouth or throat every 4 (four) hours as needed for mouth pain. 06/07/22   White, Leitha Schuller, NP  tiZANidine (ZANAFLEX) 4 MG tablet Take 1 tablet (4 mg total) by mouth every 8 (eight) hours as needed for muscle spasms. 05/31/22   Lyndee Hensen, DO  traMADol (ULTRAM) 50 MG tablet Take 1 every 4-6 hours as needed for pain not controlled by Tylenol 02/05/20   Karle Starch, MD  loratadine (CLARITIN) 10 MG tablet Take 10 mg by mouth daily as needed for allergies.  10/26/20  [provider]    Family History History reviewed. No pertinent family history.  Social History Social History  Tobacco Use   Smoking status: Never   Smokeless tobacco: Never  Vaping Use   Vaping Use: Never used  Substance Use Topics   Alcohol use: No   Drug use: No     Allergies   Oxycodone, Lactose, Codeine, Lactose intolerance (gi), Peanut butter flavor, Penicillins, Percocet [oxycodone-acetaminophen], Shrimp [shellfish allergy], Sulfa antibiotics, Vicodin [hydrocodone-acetaminophen], Hydrocodone, Nisoldipine, and Sulfasalazine   Review of Systems Review of Systems  Musculoskeletal:  Positive for arthralgias and joint swelling.  Skin:  Negative for color change.  Neurological:  Negative for numbness.     Physical Exam Triage Vital Signs ED Triage Vitals [11/29/22 1906]  Enc Vitals Group     BP       Pulse      Resp      Temp      Temp src      SpO2      Weight      Height      Head Circumference      Peak Flow      Pain Score 10     Pain Loc      Pain Edu?      Excl. in [place]?    No data found.  Updated Vital Signs BP (!) 164/83   Pulse 68   Resp 20   SpO2 99%   Visual Acuity Right Eye Distance:   Left Eye Distance:   Bilateral Distance:    Right Eye Near:   Left Eye Near:    Bilateral Near:     Physical Exam Vitals and nursing note reviewed.  Constitutional:      Appearance: Normal appearance.  Musculoskeletal:        General: Swelling, tenderness and signs of injury present. No deformity.  Skin:    General: Skin is warm and dry.     Findings: No bruising or erythema.  Neurological:     Mental Status: She is alert.      UC Treatments / Results  Labs (all labs ordered are listed, but only abnormal results are displayed) Labs Reviewed - No data to display  EKG   Radiology DG Toe Great Right  Result Date: 11/29/2022 CLINICAL DATA:  Injury EXAM: RIGHT GREAT TOE COMPARISON:  None Available. FINDINGS: Acute mildly comminuted intra-articular fracture involving the head of the first proximal phalanx on both the medial and lateral side. No subluxation. Positive for soft tissue swelling. IMPRESSION: Acute mildly comminuted intra-articular fracture involving the head of the first proximal phalanx. Electronically Signed   By: Donavan Foil M.D.   On: 11/29/2022 19:46    Procedures Procedures (including critical care time)  Medications Ordered in UC Medications - No data to display  Initial Impression / Assessment and Plan / UC Course  I have reviewed the triage vital signs and the nursing notes.  Pertinent labs & imaging results that were available during my care of the patient were reviewed by me and considered in my medical decision making (see chart for details).   Patient is a pleasant, nontoxic-appearing 62 year old female here for evaluation of pain  in her right great toe that happened as a result of tripping over a baby gate approximately 15 minutes prior to arrival.  She is complaining of pain on the dorsal surface of her toe between her IP joint and the MTP joint.  There is no erythema or ecchymosis noted but there is mild edema.  The area is also tender to touch.  She has normal sensation in  the tip of her toe.  Will obtain radiograph of her right great toe to look for presence of any bony abnormality.  Left great toe films independently reviewed and evaluated by me.  Impression: There is a questionable nondisplaced fracture of the distal aspect of the proximal phalanx of the right great toe.  Radiology overread is pending. Radiology impression states acute mildly comminuted intra-articular fracture involving the head of the first proximal phalanx.  I will discharge patient home with diagnosis of proximal phalanx fracture of her right great toe.  I will order Hard sole shoe to help patient with ambulation minimize flexion to control pain and prevent further injury.  Patient can use over-the-counter Tylenol and or ibuprofen according to package instructions as needed for mild to moderate pain.  Ice application for 20 minutes 2-3 times a day to help with swelling.  Return precautions reviewed.   Final Clinical Impressions(s) / UC Diagnoses   Final diagnoses:  Closed nondisplaced fracture of proximal phalanx of right great toe, initial encounter     Discharge Instructions      You have broken one of the bones in your right big toe.  It should heal well on its own but will take time.  Wear the postop shoe to help prevent flexion of your toe and prevent further injury.  Use over-the-counter Tylenol and/or ibuprofen according to package instructions as needed for pain.  Elevate your right foot is much as possible to help with swelling and aid in pain relief.  You can apply ice to your toe for 20 minutes at a time 2-3 times a day to help  with pain and swelling.  If your symptoms do not improve, or new symptoms develop, either return for reevaluation or see your primary care provider.     ED Prescriptions   None    I have reviewed the PDMP during this encounter.   Margarette Canada, NP 11/29/22 1954

## 2022-11-29 NOTE — ED Triage Notes (Addendum)
Pt fell over baby gait and right great toe bent backward on floor and now c/o pain and swelling. Injury occurred today

## 2024-02-04 ENCOUNTER — Ambulatory Visit: Payer: Self-pay | Admitting: Emergency Medicine

## 2024-02-04 ENCOUNTER — Ambulatory Visit
Admission: EM | Admit: 2024-02-04 | Discharge: 2024-02-04 | Disposition: A | Discharge status: Home / Self Care | Attending: Emergency Medicine | Admitting: Emergency Medicine

## 2024-02-04 ENCOUNTER — Ambulatory Visit (INDEPENDENT_AMBULATORY_CARE_PROVIDER_SITE_OTHER)

## 2024-02-04 ENCOUNTER — Encounter: Payer: Self-pay | Admitting: Emergency Medicine

## 2024-02-04 DIAGNOSIS — R051 Acute cough: Secondary | ICD-10-CM | POA: Diagnosis not present

## 2024-02-04 DIAGNOSIS — J45901 Unspecified asthma with (acute) exacerbation: Secondary | ICD-10-CM | POA: Diagnosis not present

## 2024-02-04 DIAGNOSIS — J0140 Acute pansinusitis, unspecified: Secondary | ICD-10-CM

## 2024-02-04 DIAGNOSIS — I10 Essential (primary) hypertension: Secondary | ICD-10-CM

## 2024-02-04 MED ORDER — PROMETHAZINE-DM 6.25-15 MG/5ML PO SYRP: 5.0000 mL | ORAL_SOLUTION | Freq: Four times a day (QID) | ORAL | 0 refills | Status: DC | PRN

## 2024-02-04 MED ORDER — FLUTICASONE PROPIONATE 50 MCG/ACT NA SUSP: 2.0000 | Freq: Every day | NASAL | 0 refills | Status: AC

## 2024-02-04 MED ORDER — ALBUTEROL SULFATE HFA 108 (90 BASE) MCG/ACT IN AERS: 2.0000 | INHALATION_SPRAY | RESPIRATORY_TRACT | 0 refills | Status: DC | PRN

## 2024-02-04 MED ORDER — DOXYCYCLINE HYCLATE 100 MG PO CAPS: 100.0000 mg | ORAL_CAPSULE | Freq: Two times a day (BID) | ORAL | 0 refills | Status: AC

## 2024-02-04 MED ORDER — PREDNISONE 50 MG PO TABS: 50.0000 mg | ORAL_TABLET | Freq: Every day | ORAL | 0 refills | Status: DC

## 2024-02-04 MED ORDER — AEROCHAMBER MV MISC: 1 refills | Status: AC

## 2024-02-04 NOTE — ED Provider Notes (Signed)
 HPI  SUBJECTIVE:  Cindy Fisher is a 63 y.o. female who presents with and upper respiratory infection/cough productive of yellow phlegm, with nasal congestion, yellow rhinorrhea, sinus pain and pressure, ear pain, wheezing for the past 3 weeks.  No facial swelling, dental pain.  She is unable to sleep at night because of the cough.  No chest pain, shortness of breath, dyspnea on exertion.  She has been using her albuterol  inhaler twice daily without improvement in her symptoms.  Symptoms are worse with coughing.  She is concerned about pneumonia.  No antipyretic in the past 6 hours. Biotics in the past 3 months.  No GERD symptoms.  She has a past medical history of asthma with a remote admission.  No intubations.  She also has a history of cardiac murmur, hypertension, is compliant with her amlodipine .  She takes her blood pressure intermittently at home and states that it has been running [phone] recently after having her amlodipine  decreased to 5 mg from 10 mg.  She also has a history of allergies.  PCP: Floria Hurst.   Past Medical History:  Diagnosis Date   Arthritis    Asthma    Heart murmur    Hypertension     Past Surgical History:  Procedure Laterality Date   ABDOMINAL HYSTERECTOMY     BROW LIFT Bilateral 02/05/2020   Procedure: BLEPHAROPLASTY UPPER EYELID; W/EXCESS SKIN BLEPHAROPTOSIS REPAIR; RESECT EX;  Surgeon: Zacarias Hermann, MD;  Location: [facility];  Service: Ophthalmology;  Laterality: Bilateral;   FOOT SURGERY Left    FOOT SURGERY Right 12/12/2021   HAND SURGERY Right     History reviewed. No pertinent family history.  Social History   Tobacco Use   Smoking status: Never    Passive exposure: Never   Smokeless tobacco: Never  Vaping Use   Vaping status: Never Used  Substance Use Topics   Alcohol use: No   Drug use: No    No current facility-administered medications for this encounter.  Current Outpatient Medications:    albuterol  (VENTOLIN   HFA) 108 (90 Base) MCG/ACT inhaler, Inhale 2 puffs into the lungs every 4 (four) hours as needed for wheezing or shortness of breath., Disp: 1 each, Rfl: 0   doxycycline  (VIBRAMYCIN ) 100 MG capsule, Take 1 capsule (100 mg total) by mouth 2 (two) times daily for 10 days., Disp: 20 capsule, Rfl: 0   fluticasone  (FLONASE ) 50 MCG/ACT nasal spray, Place 2 sprays into both nostrils daily., Disp: 16 g, Rfl: 0   mupirocin ointment (BACTROBAN) 2 %, Apply topically daily as needed., Disp: , Rfl:    predniSONE (DELTASONE) 50 MG tablet, Take 1 tablet (50 mg total) by mouth daily with breakfast., Disp: 5 tablet, Rfl: 0   promethazine -dextromethorphan (PROMETHAZINE -DM) 6.25-15 MG/5ML syrup, Take 5 mLs by mouth 4 (four) times daily as needed for cough., Disp: 118 mL, Rfl: 0   Spacer/Aero-Holding Chambers (AEROCHAMBER MV) inhaler, Use as instructed, Disp: 1 each, Rfl: 1   amLODipine  (NORVASC ) 5 MG tablet, Take 5 mg by mouth daily., Disp: , Rfl:    fluticasone  (FLOVENT  HFA) 110 MCG/ACT inhaler, Inhale into the lungs 2 (two) times daily., Disp: , Rfl:    gabapentin  (NEURONTIN ) 300 MG capsule, Take 1 capsule (300 mg total) by mouth 3 (three) times daily for 10 days., Disp: 30 capsule, Rfl: 0  Allergies  Allergen Reactions   Oxycodone Hives and Nausea And Vomiting    PERCOCET   Shellfish Allergy Anaphylaxis    "choking"  Lactose Diarrhea   Codeine Hives   Lactose Intolerance (Gi) Diarrhea   Peanut Butter Flavoring Agent (Non-Screening)    Penicillins Hives and Other (See Comments)    Has patient had a PCN reaction causing immediate rash, facial/tongue/throat swelling, SOB or lightheadedness with hypotension: No Has patient had a PCN reaction causing severe rash involving mucus membranes or skin necrosis: No Has patient had a PCN reaction that required hospitalization No Has patient had a PCN reaction occurring within the last 10 years: No If all of the above answers are "NO", then may proceed with  Cephalosporin use.   Percocet [Oxycodone-Acetaminophen ] Nausea And Vomiting   Sulfa Antibiotics Nausea And Vomiting   Vicodin [Hydrocodone-Acetaminophen ] Nausea And Vomiting   Hydrocodone Nausea And Vomiting    VICODIN, NORCO   Nisoldipine Anxiety    Other Reaction: Not Assessed   Sulfasalazine Nausea And Vomiting     ROS  As noted in HPI.   Physical Exam  BP (!) 169/87 (BP Location: Right Arm)   Pulse 73   Temp 98.3 F (36.8 C) (Oral)   Resp 18   Wt 63 kg   SpO2 100%   BMI 23.84 kg/m   Constitutional: Well developed, well nourished, no acute distress. Coughing. Able to speak in full sentences Eyes: PERRL, EOMI, conjunctiva normal bilaterally HENT: Normocephalic, atraumatic,mucus membranes moist purulent nasal congestion.  Erythematous, swollen turbinates.  Positive maxillary, frontal sinus tenderness.  Unable to adequately visualize oropharynx. Respiratory: Clear to auscultation bilaterally, no rales, no wheezing, no rhonchi.  Positive anterior chest wall tenderness Cardiovascular: Normal rate and rhythm, faint murmur, no gallops, no rubs GI: nondistended skin: No rash, skin intact Musculoskeletal: no deformities Neurologic: Alert & oriented x 3, CN III-XII grossly intact, no motor deficits, sensation grossly intact Psychiatric: Speech and behavior appropriate   ED Course   Medications - No data to display  Orders Placed This Encounter  Procedures   DG Chest 2 View    Standing Status:   Standing    Number of Occurrences:   1    Reason for Exam (SYMPTOM  OR DIAGNOSIS REQUIRED):   cough 3 week h/o asthma r/o PNA   No results found for this or any previous visit (from the past 24 hours). DG Chest 2 View Result Date: 02/04/2024 CLINICAL DATA:  cough 3 week H/o asthma r/o PNA EXAM: CHEST - 2 VIEW COMPARISON:  None available. FINDINGS: Biapical pleural thickening, unchanged. No focal airspace consolidation, pleural effusion, or pneumothorax. No cardiomegaly. No acute  fracture or destructive lesion. Multilevel thoracic osteophytosis. IMPRESSION: No acute cardiopulmonary abnormality. Electronically Signed   By: Rance Burrows M.D.   On: 02/04/2024 20:03    ED Clinical Impression  1. Acute non-recurrent pansinusitis   2. Acute cough   3. Exacerbation of persistent asthma, unspecified asthma severity   4. Elevated blood pressure reading with diagnosis of hypertension      ED Assessment/Plan     1.  Patient presents with a URI that I suspect has turned into a sinusitis and triggered off an asthma exacerbation.  She qualifies for antibiotics due to duration of symptoms.  However, will check chest x-ray to rule out pneumonia.  Plan to send home with Promethazine  DM, regular scheduled albuterol  inhaler with a spacer for 4 days, then as needed thereafter, prednisone 50 mg for 5 days, doxycycline  100 mg p.o. twice daily for 10 days to treat the sinus infection.  Saline nasal irrigation, Flonase , Mucinex.  2.  Elevated blood pressure  reading with history of hypertension.  Patient states that her PCP recently decreased her amlodipine  from 10 mg to 5.  It has been elevated since then..  She has no signs or symptoms of a hypertensive emergency.  Will have her continue to monitor this at home and follow-up with her primary care provider.  Will give patient list of hypertensive emergency symptoms.  Reviewed imaging independently.  No acute cardiopulmonary disease.  Formal radiology report pending.  Will contact patient [phone] if radiology overread differs enough from mine and we need to change management.    Reviewed radiology report.  No acute cardiopulmonary disease consistent with my read.  See radiology report for full details.  Discussed  imaging, MDM, treatment plan, and plan for follow-up with patient Discussed sn/sx that should prompt return to the ED. patient agrees with plan.   Meds ordered this encounter  Medications   albuterol  (VENTOLIN  HFA) 108  (90 Base) MCG/ACT inhaler    Sig: Inhale 2 puffs into the lungs every 4 (four) hours as needed for wheezing or shortness of breath.    Dispense:  1 each    Refill:  0   predniSONE (DELTASONE) 50 MG tablet    Sig: Take 1 tablet (50 mg total) by mouth daily with breakfast.    Dispense:  5 tablet    Refill:  0   promethazine -dextromethorphan (PROMETHAZINE -DM) 6.25-15 MG/5ML syrup    Sig: Take 5 mLs by mouth 4 (four) times daily as needed for cough.    Dispense:  118 mL    Refill:  0   Spacer/Aero-Holding Chambers (AEROCHAMBER MV) inhaler    Sig: Use as instructed    Dispense:  1 each    Refill:  1   fluticasone  (FLONASE ) 50 MCG/ACT nasal spray    Sig: Place 2 sprays into both nostrils daily.    Dispense:  16 g    Refill:  0   doxycycline  (VIBRAMYCIN ) 100 MG capsule    Sig: Take 1 capsule (100 mg total) by mouth 2 (two) times daily for 10 days.    Dispense:  20 capsule    Refill:  0      *This clinic note was created using Scientist, clinical (histocompatibility and immunogenetics). Therefore, there may be occasional mistakes despite careful proofreading. ?    Ethlyn Herd, MD 02/05/24 1758

## 2024-02-04 NOTE — Discharge Instructions (Addendum)
 I did not appreciate any pneumonia on your x-ray.  Radiology did not see a pneumonia on your x-ray either.  Start Mucinex to keep the mucous thin and to decongest you.  Flonase  for nasal congestion.   You may take 600 mg of motrin  with 1000 mg of tylenol  up to 3-4 times a day as needed for pain. This is an effective combination for pain. Use a NeilMed sinus rinse with distilled water as often as you want to to reduce nasal congestion. Follow the directions on the box.  Doxycycline  for a sinus infection.  Finish this, even if you feel better.  Promethazine  DM for cough.  Take two puffs from your albuterol  inhaler with your spacer every 4 hours for 2 days, then every 6 hours for 2 days, then as needed. You can back off if you start to improve  sooner. Finish the steroids unless your doctor tells you to stop. Finish the antibiotics, even if you feel better. Make sure you drink extra fluids. Return to the ER if you get worse, have a fever >100.4, or any other concerns.   If the spacer is too expensive at the pharmacy, you can get an AeroChamber Z-Stat off of [employer] for about $10-$15.  Keep an eye on your blood pressure and follow-up with your primary care provider.  You may need to go back up to 10 mg of amlodipine .  It is important to keep your blood pressure under good control, as having a elevated blood pressure for prolonged periods of time significantly increases your risk of stroke, heart attacks, kidney damage, eye damage, and other problems. Get a validated blood pressure cuff that goes on your arm, not your wrist.  Measure your blood pressure once a day, preferably at the same time every day. Keep a log of this and bring it to your next doctor's appointment.  Bring your blood pressure cuff as well.  Return here in 2 weeks for blood pressure recheck if you're unable to find a primary care physician by then. Return immediately to the ER if you start having chest pain, headache, problems seeing, problems  talking, problems walking, if you feel like you're about to pass out, if you do pass out, if you have a seizure, or for any other concerns.   Go to www.goodrx.com to look up your medications. This will give you a list of where you can find your prescriptions at the most affordable prices. Or you can ask the pharmacist what the cash price is. This is frequently cheaper than going through insurance.

## 2024-02-04 NOTE — ED Triage Notes (Signed)
 Pt c/o productive cough x 3 weeks. Pt wants to be sure she does not have pneumonia. Pt denies sore throat.

## 2024-09-17 ENCOUNTER — Ambulatory Visit
Admission: EM | Admit: 2024-09-17 | Discharge: 2024-09-17 | Disposition: A | Discharge status: Home / Self Care | Source: Home / Self Care

## 2024-09-17 DIAGNOSIS — J4521 Mild intermittent asthma with (acute) exacerbation: Secondary | ICD-10-CM

## 2024-09-17 DIAGNOSIS — J209 Acute bronchitis, unspecified: Secondary | ICD-10-CM

## 2024-09-17 MED ORDER — PREDNISONE 20 MG PO TABS: 40.0000 mg | ORAL_TABLET | Freq: Every day | ORAL | 0 refills | Status: AC

## 2024-09-17 MED ORDER — ALBUTEROL SULFATE HFA 108 (90 BASE) MCG/ACT IN AERS: 1.0000 | INHALATION_SPRAY | Freq: Four times a day (QID) | RESPIRATORY_TRACT | 0 refills | Status: DC | PRN

## 2024-09-17 MED ORDER — PROMETHAZINE-DM 6.25-15 MG/5ML PO SYRP: 5.0000 mL | ORAL_SOLUTION | Freq: Three times a day (TID) | ORAL | 0 refills | Status: DC | PRN

## 2024-09-17 MED ORDER — AZITHROMYCIN 250 MG PO TABS: 250.0000 mg | ORAL_TABLET | Freq: Every day | ORAL | 0 refills | Status: DC

## 2024-09-17 NOTE — ED Triage Notes (Signed)
 Sx x 2 weeks  Back pain Cough--- hx of asthma  Headache

## 2024-09-17 NOTE — ED Provider Notes (Signed)
 " [facility]    CSN: [id number] Arrival date & time: 09/17/24  1217      History   Chief Complaint Chief Complaint  Patient presents with   Cough   Back Pain    HPI Cindy Fisher is a 64 y.o. female  presents for evaluation of URI symptoms for 2 weeks. Patient reports associated symptoms of cough, congestion with shortness of breath wheezing and headache. Denies N/V/D, fevers, sore throat, ear pain, body aches. Patient does have a hx of asthma.  Does not currently have an inhaler.  Patient is not an active smoker.   Reports sick contacts via family.  Pt has taken cold medicine OTC for symptoms. Pt has no other concerns at this time.    Cough Associated symptoms: headaches, shortness of breath and wheezing   Back Pain Associated symptoms: headaches     Past Medical History:  Diagnosis Date   Arthritis    Asthma    Heart murmur    Hypertension     Patient Active Problem List   Diagnosis Date Noted   Plantar fascial fibromatosis 07/17/2022   Irritable bowel syndrome with diarrhea 09/13/2021   S/P hysterectomy 08/16/2021   Chest pain 11/05/2015   Hypertension 11/05/2015   Asthma 11/05/2015   Carpal tunnel syndrome 08/18/2013   Neck pain 12/22/2012   Allergic rhinitis 05/20/2007   Chronic knee pain 05/20/2007   Asthma, intermittent 05/20/2007    Past Surgical History:  Procedure Laterality Date   ABDOMINAL HYSTERECTOMY     BROW LIFT Bilateral 02/05/2020   Procedure: BLEPHAROPLASTY UPPER EYELID; W/EXCESS SKIN BLEPHAROPTOSIS REPAIR; RESECT EX;  Surgeon: Ashley Greig HERO, MD;  Location: [facility];  Service: Ophthalmology;  Laterality: Bilateral;   FOOT SURGERY Left    FOOT SURGERY Right 12/12/2021   HAND SURGERY Right     OB History   No obstetric history on file.      Home Medications    Prior to Admission medications  Medication Sig Start Date End Date Taking? Authorizing Provider  albuterol  (VENTOLIN  HFA) 108 (90 Base) MCG/ACT  inhaler Inhale 1-2 puffs into the lungs every 6 (six) hours as needed for wheezing or shortness of breath. 09/17/24  Yes Jaamal Farooqui, Jodi R, NP  amLODipine  (NORVASC ) 5 MG tablet Take 5 mg by mouth daily.   Yes [provider]  azithromycin (ZITHROMAX) 250 MG tablet Take 1 tablet (250 mg total) by mouth daily. Take first 2 tablets together, then 1 every day until finished. 09/17/24  Yes Clary Boulais, Jodi R, NP  predniSONE  (DELTASONE ) 20 MG tablet Take 2 tablets (40 mg total) by mouth daily with breakfast for 5 days. 09/17/24 09/22/24 Yes Rashon Westrup, Jodi R, NP  promethazine -dextromethorphan (PROMETHAZINE -DM) 6.25-15 MG/5ML syrup Take 5 mLs by mouth 3 (three) times daily as needed for cough. 09/17/24  Yes Melessa Cowell, Jodi R, NP  fluticasone  (FLONASE ) 50 MCG/ACT nasal spray Place 2 sprays into both nostrils daily. 02/04/24   Van Knee, MD  fluticasone  (FLOVENT  HFA) 110 MCG/ACT inhaler Inhale into the lungs 2 (two) times daily.    [provider]  gabapentin  (NEURONTIN ) 300 MG capsule Take 1 capsule (300 mg total) by mouth 3 (three) times daily for 10 days. 12/16/21 12/26/21  Arvis Huxley B, PA-C  mupirocin ointment (BACTROBAN) 2 % Apply topically daily as needed. 11/28/23   [provider]  Spacer/Aero-Holding Chambers (AEROCHAMBER MV) inhaler Use as instructed 02/04/24   Mortenson, Ashley, MD  loratadine (CLARITIN) 10 MG tablet Take 10 mg  by mouth daily as needed for allergies.  10/26/20  [provider]    Family History History reviewed. No pertinent family history.  Social History Social History[1]   Allergies   Oxycodone, Shellfish allergy, Lactose, Codeine, Lactose intolerance (gi), Peanut butter flavoring agent (non-screening), Penicillins, Percocet [oxycodone-acetaminophen ], Sulfa antibiotics, Vicodin [hydrocodone-acetaminophen ], Hydrocodone, Nisoldipine, and Sulfasalazine   Review of Systems Review of Systems  HENT:  Positive for congestion.   Respiratory:  Positive for cough,  shortness of breath and wheezing.   Neurological:  Positive for headaches.     Physical Exam Triage Vital Signs ED Triage Vitals  Encounter Vitals Group     BP 09/17/24 1312 (!) 168/80     Girls Systolic BP Percentile --      Girls Diastolic BP Percentile --      Boys Systolic BP Percentile --      Boys Diastolic BP Percentile --      Pulse Rate 09/17/24 1312 84     Resp 09/17/24 1312 17     Temp 09/17/24 1312 98.1 F (36.7 C)     Temp Source 09/17/24 1312 Oral     SpO2 09/17/24 1312 98 %     Weight 09/17/24 1311 135 lb (61.2 kg)     Height --      Head Circumference --      Peak Flow --      Pain Score 09/17/24 1311 10     Pain Loc --      Pain Education --      Exclude from Growth Chart --    No data found.  Updated Vital Signs BP (!) 168/80 (BP Location: Right Arm)   Pulse 84   Temp 98.1 F (36.7 C) (Oral)   Resp 17   Wt 135 lb (61.2 kg)   SpO2 98%   BMI 23.17 kg/m   Visual Acuity Right Eye Distance:   Left Eye Distance:   Bilateral Distance:    Right Eye Near:   Left Eye Near:    Bilateral Near:     Physical Exam Vitals and nursing note reviewed.  Constitutional:      General: She is not in acute distress.    Appearance: She is well-developed. She is not ill-appearing.  HENT:     Head: Normocephalic and atraumatic.     Right Ear: Tympanic membrane and ear canal normal.     Left Ear: Tympanic membrane and ear canal normal.     Nose: Congestion present.     Mouth/Throat:     Mouth: Mucous membranes are moist.     Pharynx: Oropharynx is clear. Uvula midline. No oropharyngeal exudate or posterior oropharyngeal erythema.     Tonsils: No tonsillar exudate or tonsillar abscesses.  Eyes:     Conjunctiva/sclera: Conjunctivae normal.     Pupils: Pupils are equal, round, and reactive to light.  Cardiovascular:     Rate and Rhythm: Normal rate and regular rhythm.     Heart sounds: Normal heart sounds.  Pulmonary:     Effort: Pulmonary effort is normal.      Breath sounds: Normal breath sounds. No wheezing, rhonchi or rales.  Musculoskeletal:     Cervical back: Normal range of motion and neck supple.  Lymphadenopathy:     Cervical: No cervical adenopathy.  Skin:    General: Skin is warm and dry.  Neurological:     General: No focal deficit present.     Mental Status: She is alert and oriented  to person, place, and time.  Psychiatric:        Mood and Affect: Mood normal.        Behavior: Behavior normal.      UC Treatments / Results  Labs (all labs ordered are listed, but only abnormal results are displayed) Labs Reviewed - No data to display  EKG   Radiology No results found.  Procedures Procedures (including critical care time)  Medications Ordered in UC Medications - No data to display  Initial Impression / Assessment and Plan / UC Course  I have reviewed the triage vital signs and the nursing notes.  Pertinent labs & imaging results that were available during my care of the patient were reviewed by me and considered in my medical decision making (see chart for details).     Will do azithromycin antibiotic for her bronchitis given length of symptoms.  Refilled albuterol  inhaler will start prednisone  daily for 5 days.  Promethazine  DM as needed for cough.  Encourage rest fluids and PCP follow-up 2 to 3 days for recheck.  ER precautions reviewed. Final Clinical Impressions(s) / UC Diagnoses   Final diagnoses:  Mild intermittent asthma with acute exacerbation  Acute bronchitis, unspecified organism     Discharge Instructions      Start azithromycin antibiotic as prescribed.  I have refilled your albuterol  inhaler to use as needed for wheezing or shortness of breath.  Start prednisone  daily for 5 days to manage her asthma symptoms.  You may use Promethazine  DM as needed for your cough.  Please of this medication will make you drowsy.  Do not drink alcohol or drive on this medication.  Lots of rest and fluids and  follow-up with your PCP in 2 to 3 days for recheck.  Please go to the ER for any worsening symptoms.  Hope you feel better soon!    ED Prescriptions     Medication Sig Dispense Auth. Provider   albuterol  (VENTOLIN  HFA) 108 (90 Base) MCG/ACT inhaler Inhale 1-2 puffs into the lungs every 6 (six) hours as needed for wheezing or shortness of breath. 1 each Garris Melhorn, Jodi R, NP   predniSONE  (DELTASONE ) 20 MG tablet Take 2 tablets (40 mg total) by mouth daily with breakfast for 5 days. 10 tablet Emmilia Sowder, Jodi R, NP   promethazine -dextromethorphan (PROMETHAZINE -DM) 6.25-15 MG/5ML syrup Take 5 mLs by mouth 3 (three) times daily as needed for cough. 118 mL Merly Hinkson, Jodi R, NP   azithromycin (ZITHROMAX) 250 MG tablet Take 1 tablet (250 mg total) by mouth daily. Take first 2 tablets together, then 1 every day until finished. 6 tablet Maveric Debono, Jodi R, NP      PDMP not reviewed this encounter.    [1]  Social History Tobacco Use   Smoking status: Never    Passive exposure: Never   Smokeless tobacco: Never  Vaping Use   Vaping status: Never Used  Substance Use Topics   Alcohol use: No   Drug use: No     Cindy Myla SAUNDERS, NP 09/17/24 1332  "

## 2024-09-17 NOTE — Discharge Instructions (Addendum)
 Start azithromycin antibiotic as prescribed.  I have refilled your albuterol  inhaler to use as needed for wheezing or shortness of breath.  Start prednisone  daily for 5 days to manage her asthma symptoms.  You may use Promethazine  DM as needed for your cough.  Please of this medication will make you drowsy.  Do not drink alcohol or drive on this medication.  Lots of rest and fluids and follow-up with your PCP in 2 to 3 days for recheck.  Please go to the ER for any worsening symptoms.  Hope you feel better soon!

## 2024-10-01 ENCOUNTER — Ambulatory Visit

## 2024-10-01 ENCOUNTER — Encounter: Payer: Self-pay | Admitting: Emergency Medicine

## 2024-10-01 ENCOUNTER — Ambulatory Visit
Admission: EM | Admit: 2024-10-01 | Discharge: 2024-10-01 | Disposition: A | Discharge status: Home / Self Care | Attending: Family Medicine | Admitting: Family Medicine

## 2024-10-01 DIAGNOSIS — R051 Acute cough: Secondary | ICD-10-CM | POA: Diagnosis not present

## 2024-10-01 DIAGNOSIS — R5383 Other fatigue: Secondary | ICD-10-CM | POA: Insufficient documentation

## 2024-10-01 DIAGNOSIS — J988 Other specified respiratory disorders: Secondary | ICD-10-CM | POA: Diagnosis not present

## 2024-10-01 DIAGNOSIS — J4521 Mild intermittent asthma with (acute) exacerbation: Secondary | ICD-10-CM | POA: Diagnosis not present

## 2024-10-01 LAB — CBC WITH DIFFERENTIAL/PLATELET
Abs Immature Granulocytes: 0.06 K/uL (ref 0.00–0.07)
Basophils Absolute: 0 K/uL (ref 0.0–0.1)
Basophils Relative: 0 %
Eosinophils Absolute: 0.1 K/uL (ref 0.0–0.5)
Eosinophils Relative: 1 %
HCT: 39 % (ref 36.0–46.0)
Hemoglobin: 12.6 g/dL (ref 12.0–15.0)
Immature Granulocytes: 0 %
Lymphocytes Relative: 10 %
Lymphs Abs: 1.3 K/uL (ref 0.7–4.0)
MCH: 29.4 pg (ref 26.0–34.0)
MCHC: 32.3 g/dL (ref 30.0–36.0)
MCV: 91.1 fL (ref 80.0–100.0)
Monocytes Absolute: 1.2 K/uL — ABNORMAL HIGH (ref 0.1–1.0)
Monocytes Relative: 9 %
Neutro Abs: 10.9 K/uL — ABNORMAL HIGH (ref 1.7–7.7)
Neutrophils Relative %: 80 %
Platelets: 284 K/uL (ref 150–400)
RBC: 4.28 MIL/uL (ref 3.87–5.11)
RDW: 13.2 % (ref 11.5–15.5)
WBC: 13.6 K/uL — ABNORMAL HIGH (ref 4.0–10.5)
nRBC: 0 % (ref 0.0–0.2)

## 2024-10-01 LAB — COMPREHENSIVE METABOLIC PANEL WITH GFR
ALT: 11 U/L (ref 0–44)
AST: 18 U/L (ref 15–41)
Albumin: 4.4 g/dL (ref 3.5–5.0)
Alkaline Phosphatase: 92 U/L (ref 38–126)
Anion gap: 12 (ref 5–15)
BUN: 12 mg/dL (ref 8–23)
CO2: 28 mmol/L (ref 22–32)
Calcium: 9.8 mg/dL (ref 8.9–10.3)
Chloride: 102 mmol/L (ref 98–111)
Creatinine, Ser: 0.84 mg/dL (ref 0.44–1.00)
GFR, Estimated: >60 mL/min
Glucose, Bld: 101 mg/dL — ABNORMAL HIGH (ref 70–99)
Potassium: 3.4 mmol/L — ABNORMAL LOW (ref 3.5–5.1)
Sodium: 142 mmol/L (ref 135–145)
Total Bilirubin: 0.5 mg/dL (ref 0.0–1.2)
Total Protein: 8.1 g/dL (ref 6.5–8.1)

## 2024-10-01 LAB — VITAMIN B12: Vitamin B-12: 1028 pg/mL — ABNORMAL HIGH (ref 180–914)

## 2024-10-01 MED ORDER — PROMETHAZINE-DM 6.25-15 MG/5ML PO SYRP: 2.5000 mL | ORAL_SOLUTION | Freq: Four times a day (QID) | ORAL | 0 refills | Status: AC | PRN

## 2024-10-01 MED ORDER — LEVOFLOXACIN 500 MG PO TABS: 500.0000 mg | ORAL_TABLET | Freq: Every day | ORAL | 0 refills | Status: AC

## 2024-10-01 MED ORDER — IPRATROPIUM BROMIDE 0.06 % NA SOLN: 2.0000 | Freq: Four times a day (QID) | NASAL | 0 refills | Status: AC

## 2024-10-01 MED ORDER — PREDNISONE 50 MG PO TABS: 50.0000 mg | ORAL_TABLET | Freq: Every day | ORAL | 0 refills | Status: AC

## 2024-10-01 MED ORDER — ALBUTEROL SULFATE HFA 108 (90 BASE) MCG/ACT IN AERS: 2.0000 | INHALATION_SPRAY | RESPIRATORY_TRACT | 0 refills | Status: AC | PRN

## 2024-10-01 NOTE — ED Provider Notes (Signed)
 " [facility]    CSN: [id number] Arrival date & time: 10/01/24  1621      History   Chief Complaint Chief Complaint  Patient presents with   Cough   Nasal Congestion   Generalized Body Aches    HPI Cindy Fisher is a 64 y.o. female.   HPI  History obtained from the patient. Cindy Fisher presents for body aches, nasal congestion and cough for 3-4 weeks. Doesn't feel like she is back to her normal self. She has not been eating or sleeping well.  Has been very tired.  Was exposed to influenza about a month ago.  No vomiting or diarrhea.  Her blood pressure is elevated. Has asthma and has been needing her inhaler more more.  Denies history of smoking and vaping.     Past Medical History:  Diagnosis Date   Arthritis    Asthma    Heart murmur    Hypertension     Patient Active Problem List   Diagnosis Date Noted   Plantar fascial fibromatosis 07/17/2022   Irritable bowel syndrome with diarrhea 09/13/2021   S/P hysterectomy 08/16/2021   Chest pain 11/05/2015   Hypertension 11/05/2015   Asthma 11/05/2015   Carpal tunnel syndrome 08/18/2013   Neck pain 12/22/2012   Allergic rhinitis 05/20/2007   Chronic knee pain 05/20/2007   Asthma, intermittent 05/20/2007    Past Surgical History:  Procedure Laterality Date   ABDOMINAL HYSTERECTOMY     BROW LIFT Bilateral 02/05/2020   Procedure: BLEPHAROPLASTY UPPER EYELID; W/EXCESS SKIN BLEPHAROPTOSIS REPAIR; RESECT EX;  Surgeon: Ashley Greig HERO, MD;  Location: [facility];  Service: Ophthalmology;  Laterality: Bilateral;   FOOT SURGERY Left    FOOT SURGERY Right 12/12/2021   HAND SURGERY Right     OB History   No obstetric history on file.      Home Medications    Prior to Admission medications  Medication Sig Start Date End Date Taking? Authorizing Provider  ipratropium (ATROVENT ) 0.06 % nasal spray Place 2 sprays into both nostrils 4 (four) times daily. 10/01/24  Yes Lula Michaux, DO  levofloxacin   (LEVAQUIN ) 500 MG tablet Take 1 tablet (500 mg total) by mouth daily. 10/01/24  Yes Shilo Philipson, DO  predniSONE  (DELTASONE ) 50 MG tablet Take 1 tablet (50 mg total) by mouth daily for 5 days. 10/01/24 10/06/24 Yes Zakee Deerman, DO  promethazine -dextromethorphan (PROMETHAZINE -DM) 6.25-15 MG/5ML syrup Take 2.5-5 mLs by mouth 4 (four) times daily as needed. 10/01/24  Yes Miyo Aina, DO  albuterol  (VENTOLIN  HFA) 108 (90 Base) MCG/ACT inhaler Inhale 2 puffs into the lungs every 4 (four) hours as needed for wheezing or shortness of breath. 10/01/24   Neda Willenbring, DO  amLODipine  (NORVASC ) 5 MG tablet Take 5 mg by mouth daily.    [provider]  fluticasone  (FLONASE ) 50 MCG/ACT nasal spray Place 2 sprays into both nostrils daily. 02/04/24   Van Knee, MD  fluticasone  (FLOVENT  HFA) 110 MCG/ACT inhaler Inhale into the lungs 2 (two) times daily.    [provider]  gabapentin  (NEURONTIN ) 300 MG capsule Take 1 capsule (300 mg total) by mouth 3 (three) times daily for 10 days. 12/16/21 12/26/21  Arvis Huxley B, PA-C  mupirocin ointment (BACTROBAN) 2 % Apply topically daily as needed. 11/28/23   [provider]  Spacer/Aero-Holding Chambers (AEROCHAMBER MV) inhaler Use as instructed 02/04/24   Mortenson, Ashley, MD  loratadine (CLARITIN) 10 MG tablet Take 10 mg by mouth daily as needed for allergies.  10/26/20  [provider]    Family History No family history on file.  Social History Social History[1]   Allergies   Oxycodone, Shellfish allergy, Sulfa antibiotics, Lactose, Codeine, Lactose intolerance (gi), Peanut butter flavoring agent (non-screening), Penicillins, Percocet [oxycodone-acetaminophen ], Vicodin [hydrocodone-acetaminophen ], Hydrocodone, Nisoldipine, and Sulfasalazine   Review of Systems Review of Systems: negative unless otherwise stated in HPI.      Physical Exam Triage Vital Signs ED Triage Vitals  Encounter Vitals Group     BP  10/01/24 1713 (!) 159/90     Girls Systolic BP Percentile --      Girls Diastolic BP Percentile --      Boys Systolic BP Percentile --      Boys Diastolic BP Percentile --      Pulse Rate 10/01/24 1713 90     Resp 10/01/24 1713 16     Temp 10/01/24 1713 98.6 F (37 C)     Temp Source 10/01/24 1713 Oral     SpO2 10/01/24 1713 95 %     Weight 10/01/24 1710 140 lb (63.5 kg)     Height --      Head Circumference --      Peak Flow --      Pain Score 10/01/24 1712 10     Pain Loc --      Pain Education --      Exclude from Growth Chart --    No data found.  Updated Vital Signs BP 118/80 (BP Location: Left Arm)   Pulse 90   Temp 98.6 F (37 C) (Oral)   Resp 16   Wt 63.5 kg   SpO2 95%   BMI 24.03 kg/m   Visual Acuity Right Eye Distance:   Left Eye Distance:   Bilateral Distance:    Right Eye Near:   Left Eye Near:    Bilateral Near:     Physical Exam GEN:     alert, non-toxic appearing female in no distress    HENT:  mucus membranes moist,  moderate erythematous edematous turbinates, clear nasal discharge, bilateral TM normal EYES:   pupils equal and reactive, no scleral injection or discharge NECK:  good ROM, no meningismus   RESP:  no increased work of breathing, clear to auscultation bilaterally CVS:   regular rate and rhythm Skin:   warm and dry    UC Treatments / Results  Labs (all labs ordered are listed, but only abnormal results are displayed) Labs Reviewed  CBC WITH DIFFERENTIAL/PLATELET - Abnormal; Notable for the following components:      Result Value   WBC 13.6 (*)    Neutro Abs 10.9 (*)    Monocytes Absolute 1.2 (*)    All other components within normal limits  VITAMIN B12  COMPREHENSIVE METABOLIC PANEL WITH GFR    EKG   Radiology DG Chest 2 View Result Date: 10/01/2024 CLINICAL DATA:  Cough. EXAM: CHEST - 2 VIEW COMPARISON:  Chest radiograph dated 02/04/2024. FINDINGS: The heart size and mediastinal contours are within normal limits. Both  lungs are clear. The visualized skeletal structures are unremarkable. IMPRESSION: No active cardiopulmonary disease. Electronically Signed   By: Vanetta Chou M.D.   On: 10/01/2024 17:59     Procedures Procedures (including critical care time)  Medications Ordered in UC Medications - No data to display  Initial Impression / Assessment and Plan / UC Course  I have reviewed the triage vital signs and the nursing notes.  Pertinent labs & imaging results that  were available during my care of the patient were reviewed by me and considered in my medical decision making (see chart for details).  Clinical Course as of 10/01/24 2002  Thu Oct 01, 2024  1812 BP recheck 118/80   [VB]    Clinical Course User Index [VB] Kriste Berth, DO      Pt is a 64 y.o. female who presents for 4-5 weeks of fatigue, nasal congestion and cough that is not improving.  Cindy Fisher is  afebrile here without recent antipyretics. Satting adequately on room air. Overall pt is  non-toxic appearing, well hydrated, without respiratory distress. Pulmonary exam is unremarkable except for cough.  After shared decision making, we will  pursue chest x-ray at this time.  COVID  and influenza testing deferred due to length of symptoms.  Discussed obtaining some lab work to look for secondary causes of her fatigue.  She is agreeable.  Collected CBC, CMP and B12.   Chest xray personally reviewed by me without focal pneumonia, pleural effusion, cardiomegaly or pneumothorax.   Treat acute  lower respiratory tract infection with steroids and antibiotics as below. Albuterol  inhaler and  Atrovent  nasal spray prescribed.  Decreased Promethazine  DM cough syrup given for cough and allow patient to rest.  Typical duration of symptoms discussed. Return and ED precautions given and patient voiced understanding.   Discussed MDM, treatment plan and plan for follow-up with patient who agrees with plan.      Final Clinical Impressions(s) /  UC Diagnoses   Final diagnoses:  Acute cough  Other fatigue  Respiratory tract infection     Discharge Instructions      Your chest x-ray was normal.  You do not have a pneumonia and your lungs were fully inflated without any fluid.  I obtained some blood work to look for other secondary causes of your symptoms.  You will see this blood work result in Pharmacologist.  If significantly abnormal, someone will call you.  Stop by the pharmacy to pick up your prescription.  You can use 2.5 to 5 mL of the prescription cough medication as needed.   Take a tablet of Zinc with your airborne. Put some yogurt with live bacterial cultures in a smoothie and drink once  day to keep your good bacteria around. This will help prevent diarrhea.      ED Prescriptions     Medication Sig Dispense Auth. Provider   promethazine -dextromethorphan (PROMETHAZINE -DM) 6.25-15 MG/5ML syrup Take 2.5-5 mLs by mouth 4 (four) times daily as needed. 118 mL Mayzee Reichenbach, DO   albuterol  (VENTOLIN  HFA) 108 (90 Base) MCG/ACT inhaler Inhale 2 puffs into the lungs every 4 (four) hours as needed for wheezing or shortness of breath. 1 each Salma Walrond, DO   ipratropium (ATROVENT ) 0.06 % nasal spray Place 2 sprays into both nostrils 4 (four) times daily. 15 mL Makala Fetterolf, DO   levofloxacin  (LEVAQUIN ) 500 MG tablet Take 1 tablet (500 mg total) by mouth daily. 7 tablet Hazelle Woollard, DO   predniSONE  (DELTASONE ) 50 MG tablet Take 1 tablet (50 mg total) by mouth daily for 5 days. 5 tablet Kriste Berth, DO      PDMP not reviewed this encounter.     [1]  Social History Tobacco Use   Smoking status: Never    Passive exposure: Never   Smokeless tobacco: Never  Vaping Use   Vaping status: Never Used  Substance Use Topics   Alcohol use: No   Drug use: No  Anushri Casalino, DO 10/01/24 2002  "

## 2024-10-01 NOTE — Discharge Instructions (Addendum)
 Your chest x-ray was normal.  You do not have a pneumonia and your lungs were fully inflated without any fluid.  I obtained some blood work to look for other secondary causes of your symptoms.  You will see this blood work result in Pharmacologist.  If significantly abnormal, someone will call you.  Stop by the pharmacy to pick up your prescription.  You can use 2.5 to 5 mL of the prescription cough medication as needed.   Take a tablet of Zinc with your airborne. Put some yogurt with live bacterial cultures in a smoothie and drink once  day to keep your good bacteria around. This will help prevent diarrhea.

## 2024-10-01 NOTE — ED Triage Notes (Signed)
 Pt presents with a cough, congestions and bodyaches x 1 month. Pt was seen here 09/17/24 and is not better.

## 2024-10-02 ENCOUNTER — Ambulatory Visit (HOSPITAL_COMMUNITY): Payer: Self-pay
